# Patient Record
Sex: Male | Born: 1940 | Race: Black or African American | Hispanic: No | Marital: Married | Smoking: Former smoker
Health system: Southern US, Community
[De-identification: ages and names within clinical notes are randomized; demographics above are authoritative.]

## PROBLEM LIST (undated history)

## (undated) DIAGNOSIS — K635 Polyp of colon: Secondary | ICD-10-CM

## (undated) DIAGNOSIS — E119 Type 2 diabetes mellitus without complications: Secondary | ICD-10-CM

## (undated) DIAGNOSIS — K222 Esophageal obstruction: Secondary | ICD-10-CM

## (undated) DIAGNOSIS — I639 Cerebral infarction, unspecified: Secondary | ICD-10-CM

## (undated) DIAGNOSIS — K226 Gastro-esophageal laceration-hemorrhage syndrome: Secondary | ICD-10-CM

## (undated) DIAGNOSIS — K5790 Diverticulosis of intestine, part unspecified, without perforation or abscess without bleeding: Secondary | ICD-10-CM

## (undated) DIAGNOSIS — C61 Malignant neoplasm of prostate: Secondary | ICD-10-CM

## (undated) DIAGNOSIS — K648 Other hemorrhoids: Secondary | ICD-10-CM

## (undated) DIAGNOSIS — I10 Essential (primary) hypertension: Secondary | ICD-10-CM

## (undated) HISTORY — DX: Type 2 diabetes mellitus without complications: E11.9

## (undated) HISTORY — DX: Malignant neoplasm of prostate: C61

## (undated) HISTORY — PX: UPPER GASTROINTESTINAL ENDOSCOPY: SHX188

## (undated) HISTORY — DX: Essential (primary) hypertension: I10

## (undated) HISTORY — PX: COLONOSCOPY: SHX174

## (undated) HISTORY — DX: Diverticulosis of intestine, part unspecified, without perforation or abscess without bleeding: K57.90

## (undated) HISTORY — DX: Esophageal obstruction: K22.2

## (undated) HISTORY — DX: Cerebral infarction, unspecified: I63.9

## (undated) HISTORY — DX: Other hemorrhoids: K64.8

## (undated) HISTORY — DX: Polyp of colon: K63.5

## (undated) HISTORY — PX: HEMICOLECTOMY: SHX854

## (undated) HISTORY — DX: Gastro-esophageal laceration-hemorrhage syndrome: K22.6

---

## 2001-01-07 ENCOUNTER — Emergency Department (HOSPITAL_COMMUNITY): Admission: EM | Admit: 2001-01-07 | Discharge: 2001-01-08 | Payer: Self-pay | Admitting: *Deleted

## 2001-01-15 ENCOUNTER — Ambulatory Visit (HOSPITAL_COMMUNITY): Admission: RE | Admit: 2001-01-15 | Discharge: 2001-01-15 | Payer: Self-pay | Admitting: Internal Medicine

## 2001-01-15 ENCOUNTER — Encounter: Payer: Self-pay | Admitting: Internal Medicine

## 2003-04-13 ENCOUNTER — Ambulatory Visit (HOSPITAL_COMMUNITY): Admission: RE | Admit: 2003-04-13 | Discharge: 2003-04-13 | Payer: Self-pay | Admitting: Family Medicine

## 2003-10-28 ENCOUNTER — Inpatient Hospital Stay (HOSPITAL_COMMUNITY): Admission: EM | Admit: 2003-10-28 | Discharge: 2003-10-31 | Payer: Self-pay | Admitting: Emergency Medicine

## 2005-10-15 ENCOUNTER — Other Ambulatory Visit: Admission: RE | Admit: 2005-10-15 | Discharge: 2005-10-15 | Payer: Self-pay | Admitting: Urology

## 2005-10-15 ENCOUNTER — Encounter (INDEPENDENT_AMBULATORY_CARE_PROVIDER_SITE_OTHER): Payer: Self-pay | Admitting: *Deleted

## 2005-12-11 ENCOUNTER — Ambulatory Visit: Admission: RE | Admit: 2005-12-11 | Discharge: 2006-02-23 | Payer: Self-pay | Admitting: Radiation Oncology

## 2008-03-03 DIAGNOSIS — K5790 Diverticulosis of intestine, part unspecified, without perforation or abscess without bleeding: Secondary | ICD-10-CM

## 2008-03-03 DIAGNOSIS — K226 Gastro-esophageal laceration-hemorrhage syndrome: Secondary | ICD-10-CM

## 2008-03-03 DIAGNOSIS — K222 Esophageal obstruction: Secondary | ICD-10-CM

## 2008-03-03 DIAGNOSIS — K635 Polyp of colon: Secondary | ICD-10-CM

## 2008-03-03 HISTORY — DX: Gastro-esophageal laceration-hemorrhage syndrome: K22.6

## 2008-03-03 HISTORY — DX: Diverticulosis of intestine, part unspecified, without perforation or abscess without bleeding: K57.90

## 2008-03-03 HISTORY — DX: Esophageal obstruction: K22.2

## 2008-03-03 HISTORY — DX: Polyp of colon: K63.5

## 2008-03-23 ENCOUNTER — Ambulatory Visit: Payer: Self-pay | Admitting: Gastroenterology

## 2008-03-23 ENCOUNTER — Inpatient Hospital Stay (HOSPITAL_COMMUNITY): Admission: EM | Admit: 2008-03-23 | Discharge: 2008-03-25 | Payer: Self-pay | Admitting: Emergency Medicine

## 2008-04-26 ENCOUNTER — Ambulatory Visit: Payer: Self-pay | Admitting: Gastroenterology

## 2008-10-25 ENCOUNTER — Encounter: Payer: Self-pay | Admitting: Gastroenterology

## 2008-10-28 DIAGNOSIS — Z8673 Personal history of transient ischemic attack (TIA), and cerebral infarction without residual deficits: Secondary | ICD-10-CM

## 2008-10-28 DIAGNOSIS — E119 Type 2 diabetes mellitus without complications: Secondary | ICD-10-CM

## 2008-10-28 DIAGNOSIS — I10 Essential (primary) hypertension: Secondary | ICD-10-CM | POA: Insufficient documentation

## 2008-10-28 DIAGNOSIS — K5730 Diverticulosis of large intestine without perforation or abscess without bleeding: Secondary | ICD-10-CM | POA: Insufficient documentation

## 2008-10-28 DIAGNOSIS — C61 Malignant neoplasm of prostate: Secondary | ICD-10-CM

## 2008-10-28 DIAGNOSIS — K922 Gastrointestinal hemorrhage, unspecified: Secondary | ICD-10-CM | POA: Insufficient documentation

## 2008-11-01 ENCOUNTER — Ambulatory Visit: Payer: Self-pay | Admitting: Gastroenterology

## 2009-04-17 ENCOUNTER — Ambulatory Visit: Payer: Self-pay | Admitting: Orthopedic Surgery

## 2009-04-17 DIAGNOSIS — M7020 Olecranon bursitis, unspecified elbow: Secondary | ICD-10-CM

## 2009-10-23 ENCOUNTER — Encounter: Payer: Self-pay | Admitting: Urgent Care

## 2009-11-13 ENCOUNTER — Ambulatory Visit (HOSPITAL_COMMUNITY): Admission: RE | Admit: 2009-11-13 | Discharge: 2009-11-13 | Payer: Self-pay | Admitting: Family Medicine

## 2010-02-28 ENCOUNTER — Ambulatory Visit (HOSPITAL_COMMUNITY)
Admission: RE | Admit: 2010-02-28 | Discharge: 2010-02-28 | Payer: Self-pay | Source: Home / Self Care | Admitting: Urology

## 2010-05-07 ENCOUNTER — Ambulatory Visit (HOSPITAL_COMMUNITY)
Admission: RE | Admit: 2010-05-07 | Discharge: 2010-05-07 | Payer: Self-pay | Source: Home / Self Care | Admitting: Urology

## 2010-07-03 NOTE — Medication Information (Signed)
Summary: Tax adviser   Imported By: Diana Eves 10/23/2009 11:49:46  _____________________________________________________________________  External Attachment:    Type:   Image     Comment:   External Document  Appended Document: RX FolderOMEPRAZOLE    Prescriptions: OMEPRAZOLE 20 MG CPDR (OMEPRAZOLE) one by mouth daily 30  mins before breakfast as long as you are on aspirin  #30 x 11   Entered and Authorized by:   Joselyn Arrow FNP-BC   Signed by:   Joselyn Arrow FNP-BC on 10/23/2009   Method used:   Electronically to        [facility]* (retail)       [address]       [place], [place]  [address]       Ph: [phone]       Fax: [phone]   RxID:   [id number] OMEPRAZOLE 20 MG CPDR (OMEPRAZOLE) one by mouth daily 30  mins before breakfast as long as you are on aspirin  #30 x 11   Entered and Authorized by:   Joselyn Arrow FNP-BC   Signed by:   Joselyn Arrow FNP-BC on 10/23/2009   Method used:   Electronically to        [facility]* (retail)       [address]       [place], [place]  [address]       Ph: [phone]       Fax: [phone]   RxID:   [id number]

## 2010-10-16 NOTE — Discharge Summary (Signed)
[name], [name] NO.:  [id number]   MEDICAL RECORD NO.:  [id number]          PATIENT TYPE:  INP   LOCATION:  IC08                          FACILITY:  [facility]   PHYSICIAN:  Osvaldo Shipper, MD     DATE OF BIRTH:  19-Apr-1941   DATE OF ADMISSION:  03/23/2008  DATE OF DISCHARGE:  LH                               DISCHARGE SUMMARY   PRIMARY CARE DOCTOR:  Dr. Nobie Putnam.   During this admission, the patient was seen by Dr. Kassie Mends from  gastroenterology.   PROCEDURES PERFORMED DURING THIS ADMISSION:  An EGD and colonoscopy.   The patient to be transferred to [facility] for surgical evaluation when  a bed is available.   TRANSFER DIAGNOSES:  1. Lower gastrointestinal bleed secondary to diverticulosis, requiring      surgical evaluation.  2. Upper gastrointestinal bleed secondary to Mallory-Weiss tear,      status post clipping and epinephrine injection.  3. History of prostate cancer with treatment in the past.  4. History of cerebrovascular accident in the past.  5. He was on low-dose Coumadin for history of cerebrovascular accident      in the past.   Please review H and P dictated by Dr. Lilian Kapur for details regarding the  patient's presenting illness.   BRIEF HOSPITAL COURSE:  Briefly, this is a 70 year old African American  male who was in his is usual state of health until about a few hours  prior to presentation to the hospital when he started complaining of  lower GI bleeding per rectum.  The patient apparently felt dizzy and  lightheaded.  The patient was admitted to the hospital.  He continued to  have lower GI bleeding throughout the night, and this morning he had a  syncopal episode after one episode of bleeding.  The patient was  transfused blood.  He has received only 1 unit so far and currently the  second unit is infusing.  His blood count in the form of hemoglobin was  13.6 at 11:00 p.m. last night, it dropped down to 10.4 at 5 a.m. this  morning, it was 10.9 at 5 p.m. this evening.  Platelet count is stable.  PT/INR was normal.  His other labs are all pretty much unremarkable.   The patient was seen by Dr. Kassie Mends from gastroenterology.  The  patient underwent an EGD and a colonoscopy.  The EGD showed a Mallory-  Weiss tear, and this was clipped and epinephrine was injected.  He was  found to have severe diverticulosis with bleeding.  Dr. Kassie Mends felt  that the patient needed surgical evaluation for his lower GI bleeding  from diverticulosis.  The patient and his family do not want to be seen  by Dr. Lovell Sheehan, who is covering here at [facility].  In view of this, I  spoke initially with Dr. Synthia Innocent, the ICU doctor at [facility], who  recommended that I speak with one of the [facility] doctors as the  patient may not require an ICU bed as he is hemodynamically stable.  So,  I spoke with Dr. Vedia Coffer with [facility], who asked me to speak with  the general surgeon on-call to make sure they would be able to see the  patient before he will admit him.  I spoke to Dr. Laverta Baltimore,  general surgeon and she said that she will evaluate the patient.  She  did not feel that there was an acute need because no acute bleeding was  noted and because he was hemodynamically stable.  So, Dr. Orvan Falconer has  accepted the patient transfer.  Unfortunately, there are no step-down  beds available at this time.  So, we will await a bed for his transfer.   If the patient continues to have significant bleeding with compromise to  his blood pressure we will re-contact Dr. Sung Amabile to see if he will  accept the patient in transfer.   At the time of this dictation, he is on Norvasc 5 mg daily, he is on  Protonix 40 mg p.o. daily, he is on sequential compressive devices.      Osvaldo Shipper, MD  Electronically Signed     GK/MEDQ  D:  03/23/2008  T:  03/23/2008  Job:  [id number]   cc:   Patrica Duel, M.D.  Fax: [phone]    Kassie Mends, M.D.  [address]  [place] , [place] [address]

## 2010-10-16 NOTE — Op Note (Signed)
NAMESERJIO, DEUPREE NO.:  [id number]   MEDICAL RECORD NO.:  [id number]          PATIENT TYPE:  INP   LOCATION:  IC08                          FACILITY:  [facility]   PHYSICIAN:  Kassie Mends, M.D.      DATE OF BIRTH:  13-Jul-1940   DATE OF PROCEDURE:  03/23/2008  DATE OF DISCHARGE:                               OPERATIVE REPORT   REFERRING PHYSICIAN:  Skeet Latch, M.D.   PROCEDURE:  1. Ileocolonoscopy.  2. Limited esophagogastroduodenoscopy with Boston resolution clips      placed and epinephrine injection.   INDICATION FOR EXAM:  Mr. Colaizzi is a 70 year old male who presented  with large volume painless rectal bleeding.  He was given a bowel prep  and vomited with GoLYTELY.  The emesis contained bright red blood.  He  has had 2 presyncopal episodes but other than that has remained  hemodynamically stable.   FINDINGS:  1. Prior left hemicolectomy.  The anastomosis was approximately 35 cm      from the anal verge.  Multiple diverticula seen beginning at the      anastomosis and continuing to the cecum.  A moderate amount of      liquid dark blood seen in the colon.  No bright red blood      appreciated.  2. Occasional liquid blood seen in the distal terminal ileum.  No      bright red blood in the distal terminal ileum.  3. A 6-mm sessile ascending colon polyp.  The polyp was not removed      due to the fact that the patient has an ongoing GI bleed.  4. No retroflexed view of the rectum was performed.  The forward view      of the rectum appeared within normal limits.  5. Patent distal Schatzki's ring.  No Barret's, erosion, or      ulcerations seen.  6. Just distal to this Schatzki's ring was a Mallory-Weiss tear with      adherent clot.  Mild amount of oozing was seen.  No old blood or      fresh blood was seen in the stomach.  Five Boston resolution clips      were placed on the Mallory-Weiss tear which was rather large.  Four      mL of epinephrine  (1:10,000) was injected.  Hemostasis was      achieved.  No retroflexed view of the cardia was performed, and the      scope was not advanced beyond the pylorus. Advancing the scope      could worsen the Mallory-Weiss tear due to pressure at the GE      junction.   DIAGNOSES:  1. Painless rectal bleeding likely secondary to diverticulosis.  There      is a low likelihood that he had a Mallory-Weiss tear which was not      readily apparent on presentation.  2. Mallory-Weiss tear, most likely source for hematemesis, and      hemostasis was achieved with resolution clips and epinephrine.  3. Severe diverticulosis.  RECOMMENDATIONS:  1. He may have a clear liquid diet.  2. Hemoglobin/hematocrit every 6 hours and transfuse as needed.  3. Consider transfer where surgery bed is available.  The family      declined the service of Dr. Lovell Sheehan and Dr. Leticia Penna.  4. Would hold aspirin, Coumadin and anti-inflammatory drugs until      April 06, 2008.  5. No MRI until April 24, 2008.  6. He should follow up as an outpatient to have a colonoscopy in 2      months with Dr. Cira Servant.   MEDICATIONS:  1. Demerol 50 mg IV.  2. Versed 3 mg IV.   PROCEDURE TECHNIQUE:  Physical exam was performed.  Informed consent was  obtained from the patient after explaining the benefits, risks and  alternatives to the procedure.  The patient was connected to monitor and  placed in the left lateral position.  Continuous oxygen was provided by  nasal cannula and IV medicine administered through an indwelling  cannula.  After administration of sedation and rectal exam, the  patient's rectum was intubated, and the scope was advanced under direct  visualization to the distal terminal ileum.  The scope was removed  slowly by carefully examining the color, texture, anatomy and integrity  of the mucosa on the way out.   After the colonoscopy, the patient's esophagus was intubated with a  diagnostic gastroscope.  The  scope was advanced under direct  visualization to the pylorus.  The scope was removed slowly by carefully  examining the color, texture, anatomy and integrity of the mucosa on the  way out.  Prior to withdrawal of the scope, 5 resolution clips were  placed, and epinephrine was injected.  The patient was recovered in the  ICU.   Marland Kitchen      Kassie Mends, M.D.  Electronically Signed     SM/MEDQ  D:  03/23/2008  T:  03/23/2008  Job:  [id number]   cc:   Patrica Duel, M.D.  Fax: [phone]

## 2010-10-16 NOTE — H&P (Signed)
[name], GANAS NO.:  [id number]   MEDICAL RECORD NO.:  [id number]          PATIENT TYPE:  INP   LOCATION:  A336                          FACILITY:  [facility]   PHYSICIAN:  Skeet Latch, DO    DATE OF BIRTH:  1941/03/30   DATE OF ADMISSION:  03/23/2008  DATE OF DISCHARGE:  LH                              HISTORY & PHYSICAL   PRIMARY CARE PHYSICIAN:  Patrica Duel, M.D.   CHIEF COMPLAINT:  Rectal bleeding.   HISTORY OF PRESENT ILLNESS:  This is a 70 year old African American male  presents with rectal bleeding.  The patient states he has been eating  well.  After having a sensation that he had to have a bowel movement, he  noticed some painless rectal bleeding.  The patient states that he felt  like he needed to have a bowel movement and passed stool.  He noted  blood in the toilet as well as on the toilet tissue.  The patient denied  having straining and any abdominal pain during this episode.  The  patient states that he thinks the stool was mixed with blood, but did  not notice bright red blood on toilet paper and in toilet bowel.  The  patient stated aggravated by nothing, relieved by nothing, and states  that he did have episode of abdominal pain one day prior that went away,  but denies any nausea, vomiting, syncope, fever, chills.  The patient  does take Coumadin for previous CVA in the past.   PAST MEDICAL HISTORY:  CVA, questionable colon cancer.   PAST SURGICAL HISTORY:  Colostomy bag that was reversed   SOCIAL HISTORY:  No history alcohol, illicit drugs or smoking.   ALLERGIES:  NO KNOWN DRUG ALLERGIES.   FAMILY HISTORY:  Unremarkable.   HOME MEDICATIONS:  Coumadin daily, unknown dose, multivitamin daily.  States that he takes a yellow pill, unknown dose frequency and unknown  reason.   REVIEW OF SYSTEMS:  CONSTITUTIONAL:  Unremarkable.  HEENT: Unremarkable.  CARDIOVASCULAR:  Unremarkable.  RESPIRATORY:  Unremarkable.  GASTROINTESTINAL:   Positive for bloody stools.  No nausea, vomiting,  diarrhea, abdominal pain.  GENITOURINARY:  Unremarkable.  MUSCULOSKELETAL:  Unremarkable.  All other systems unremarkable.   PHYSICAL EXAMINATION:  VITAL SIGNS:  Temperature 97.6, respirations 20,  pulse 105, blood pressure 156/84.  HEENT:  Head is atraumatic and normocephalic.  PERRL.  EOMI.  NECK:  Soft, supple, nontender, nondistended.  Oral mucosa is moist.  CARDIOVASCULAR:  Slightly tachycardiac, regular rhythm.  No murmurs,  gallops or murmurs.  LUNGS:  Clear to auscultation bilaterally.  No rales, rhonchi or  wheezing.  ABDOMEN:  Soft, nontender, nondistended.  No organomegaly.  Normal bowel  sounds.  No rigidity or guarding.  Rectal exam done by ER physician  showed gross blood per rectum.  EXTREMITIES:  No clubbing, cyanosis or edema.  NEUROLOGICAL:  Alert and oriented x3.  Cranial nerves II-XII grossly  intact.  SKIN:  Normal.  No rashes, petechiae noted.   LABORATORY DATA:  EKG showed 91 bpm, normal sinus rhythm, right axis  deviation, normal  P-waves, slight inverted T-waves.  No Q-waves noted.  Myoglobin 80.5, CK-MB 1.7, troponin less than 0.05, lipase is 36.  Sodium 137, potassium 3.4, chloride 107, CO2 is 25, glucose 156, BUN 15,  creatinine 1.32, calcium 8.8, total protein 6.2, albumin 3.4, AST is 20,  ALT 13, alkaline phosphatase is 78, total bilirubin 0.4.  PTT is 26, PT  13.3, INR is 1.0.  White count 7.8, hemoglobin 13.6, hematocrit 40.0,  platelet count is 206.  Chest x-ray showed no acute abnormalities.   ASSESSMENT:  1. Rectal bleeding.  2. History of CVA.  3. Hyperglycemia.  4. Hypokalemia.   PLAN:  1. The patient admitted to the service [facility] general medical bed.  2. Rectal bleeding, Will rehydrate the patient at this time.  Will      hold all anticoagulants, aspirin and NSAIDS at this time.  Will      obtain GI consult at this time.  At this time, will keep the      patient n.p.o. pending any  gastroenterology procedures.  Slowly      advance the patient's clear liquid diet, advance as tolerated.  3. Will repeat his hemoglobin/hematocrit as well as PT/PTT and INR      studies in the a.m.  Monitor and any active bleeding.  4. The patient is hyperglycemic, will add hemoglobin A1c to his a.m.      labs at this time.  5. For his hypokalemia will orally replace and recheck in the morning      also.      Skeet Latch, DO  Electronically Signed     SM/MEDQ  D:  03/23/2008  T:  03/23/2008  Job:  [id number]   cc:   Patrica Duel, M.D.  Fax: [phone]

## 2010-10-16 NOTE — Consult Note (Signed)
NAME:  [name], CZARNECKI NO.:  [id number]   MEDICAL RECORD NO.:  [id number]          PATIENT TYPE:  INP   LOCATION:  A336                          FACILITY:  [facility]   PHYSICIAN:  Kassie Mends, M.D.      DATE OF BIRTH:  01-Apr-1941   DATE OF CONSULTATION:  03/23/2008  DATE OF DISCHARGE:                                 CONSULTATION   REFERRING PHYSICIAN:  [facility].   REASON FOR CONSULTATION:  Hematochezia.   HISTORY OF PRESENT ILLNESS:  The patient is a 70 year old African  American gentleman who presents with acute onset of large volume  hematochezia.  He states he had a couple of episodes yesterday prior to  his presentation late last night.  It describes it as fresh blood per  rectum.  He denies any problems with straining or constipation.  He has  had no prior issues before this started.  He denies any abdominal pain.  Since his admission, he has had 2-3 episodes of what was described by  the nursing staff as very large volume bright red blood per rectum.  On  one episode, the patient did have a syncopal episode.  His systolic  pressure was in the 04V around this time, but currently is back at  106/67 with a pulse of 80.  He denies any melena, abdominal pain,  vomiting, dysphagia, odynophagia, heartburn or weight loss.  He states  he remotely used to drink alcohol just on the weekends.  He denies NSAID  or aspirin use.  He is on Coumadin 1 mg daily for history of stroke.  His INR on presentation was only 1, and today is 1.2.   HOME MEDICATIONS:  1. Coumadin 1 mg daily.  2. Multivitamin daily.   ALLERGIES:  NO KNOWN DRUG ALLERGIES.   PAST MEDICAL HISTORY:  1. CVA.  2. History of colostomy bag which was reversed in the remote past,      unclear etiology.  The patient denies a history of cancer, however.  3. He does have a history of prostate cancer treated with seed      implants.  4. Possible history of hypertension.   FAMILY HISTORY:  Negative for  colorectal cancer or chronic GI illnesses.   SOCIAL HISTORY:  He is married, he has two sons.  He is retired.  He has  prior alcohol abuse as above.  Quit smoking a couple years ago.   REVIEW OF SYSTEMS:  GI:  See HPI.  CONSTITUTIONAL:  No weight loss.  CARDIOPULMONARY:  Denies chest pain, shortness of breath, palpitations  or cough.  GENITOURINARY:  Denies dysuria or hematuria.   PHYSICAL EXAMINATION:  VITAL SIGNS:  Temperature 98, pulse 81,  respirations 20, blood pressure 106/67.  O2 sats 97% on room air.  Weight 99.8 kg.  Height 66 inches.GENERAL:  A pleasant, drowsy elderly  black gentleman in no acute distress.  He recently received  Phenergan.SKIN:  Warm and dry, no jaundice.HEENT:  Sclerae anicteric.  Oropharyngeal mucosa moist.CHEST:  Lungs are clear to  auscultation.CARDIAC:  Reveals a regular rate and rhythm.  Normal S1-S2.  No murmurs, rubs or gallops.  ABDOMEN:  Positive bowel sounds.  Abdomen is soft, nontender,  nondistended.  No organomegaly or masses.  He has a vertical incision  from his lower abdomen extending all the way up to above the umbilicus.  Scar from prior colostomy bag in the left mid abdomen.  No abdominal  bruits or hernia.  No rebound or guarding.LOWER EXTREMITIES:  No edema.   LABORATORY DATA:  Sodium 139, potassium 4.2, BUN 14, creatinine 1.32,  glucose 187, total bilirubin 0.4, alkaline phosphatase 56, AST 19, ALT  12, albumin 2.8, lipase 36, PTT 24, INR 1.2.  White blood cell count  7200.  Hemoglobin on admission was 13.6, down to 10.4 this morning.  Platelets 178,000.   IMPRESSION:  The patient is a 70 year old African American gentleman  with large volume hematochezia.  Differential diagnoses includes a  diverticular bleed, colorectal cancer, less likely ischemic colitis,  less likely rapid transient upper gastrointestinal bleed.   RECOMMENDATIONS:  1. Colonoscopy today as discussed with Dr. Kassie Mends.  2. Continue PPI.  3. Further  recommendations to follow.   I would like to thank [facility] for allowing [place] to take part in  the care of this patient.      Tana Coast, P.A.      Kassie Mends, M.D.  Electronically Signed    LL/MEDQ  D:  03/23/2008  T:  03/23/2008  Job:  [id number]   cc:   Patrica Duel, M.D.  Fax: [phone]   [facility] P Team

## 2010-10-16 NOTE — Group Therapy Note (Signed)
NAMEJOHNGABRIEL, [name] NO.:  [id number]   MEDICAL RECORD NO.:  [id number]          PATIENT TYPE:  INP   LOCATION:  IC08                          FACILITY:  [facility]   PHYSICIAN:  Osvaldo Shipper, MD     DATE OF BIRTH:  1941-04-01   DATE OF PROCEDURE:  03/24/2008  DATE OF DISCHARGE:                                 PROGRESS NOTE   This is day 2 of this admission.   Patient continues to be in the intensive care unit here at [facility].   SUBJECTIVELY:  Patient is feeling much better.  He stated he is much  improved compared to yesterday.  He denies any more bleeding since 7  p.m. last night when he had his last episode.  He denies any abdominal  pain, nausea, vomiting.  He denies any more syncopal episodes and  overall he feels much better.   OBJECTIVELY:  He is afebrile.  His heart rate is in the 60s and 70s.  Blood pressure is normal 132/66.  Respiratory rate is 14.  Saturation  95% on 2 L by nasal cannula.   PHYSICAL EXAMINATION:  Obese black male in no distress.  HEENT:  There is no pallor, no icterus.  Oral mucous membrane is moist.  No oral lesions are noted.  NECK:  Soft and supple.  No thyromegaly is appreciated.  LUNGS:  Clear to auscultation bilaterally.  No wheezes, rales or  rhonchi.  CARDIOVASCULAR:  S1, S2 is normal, regular.  No murmurs appreciated.  No  S3, S4.  No rubs, no bruits.  ABDOMEN:  Soft, nontender, nondistended.  EXTREMITIES:  Do not show edema.  NEUROLOGIC:  He is alert, oriented x3.  No focal neurological deficits  are present.   LABORATORIES:  His H and H has been stable, it was 11.1.  His white  count is normal.  Platelet count is 145 this morning.  His Hb1c came  back at 6.5.   ASSESSMENT AND PLAN:  1. Lower gastrointestinal bleeding.  Diverticular.  He is status post      colonoscopy.  Gastroenterologist recommends surgical evaluation.      Patient does not want to see Dr. Lovell Sheehan so he will be transferred      to [facility]  sometime today to [facility] over there so that they      can obtain a surgical evaluation.  I have already discussed the      case with Dr. Laverta Baltimore who is a general surgeon there and      she or one of her colleagues will be happy to consult on this      individual.  2. Mallory-Weiss tear causing hematemesis also resolved.  He underwent      an EGD with clipping and epinephrine injection.  3. Hyperglyceridemia.  Patient appears to have diabetes.  He is not,      as far as we know, not on any anti-diabetic medications.  Looking      back in his records it appears that this will be a new diagnosis  for him.  This will most likely will have to be addressed by his      primary medical doctor when he is discharged from [facility].  He      may also be started on oral hyperglycemic agents such as metformin      or glipizide.  4. History of a stroke, stable.  5. History of colectomy in the past for unclear reasons.  6. He is on deep vein thrombosis prophylaxis with sequential      compression devices.  7. He has a history of hypertension for which he is on Norvasc which      is also stable.   So at this time we are waiting transfer to [facility].  He has been  accepted by Dr. Vedia Coffer and he has been assigned to [facility] team  D.  If the patient does not leave by early this morning, I will give a  call to the [facility] team to update that the patient is still here and  that he might be coming sometime today.   His discharge summary was dictated yesterday.      Osvaldo Shipper, MD  Electronically Signed     GK/MEDQ  D:  03/24/2008  T:  03/24/2008  Job:  [id number]   cc:   [facility] D team  [facility]   Lennie Muckle, MD  [address]   [address]  [place] [address]   Patrica Duel, M.D.  Fax: [phone]

## 2010-10-16 NOTE — Consult Note (Signed)
NAME:  [name], DELORENZO NO.:  [id number]   MEDICAL RECORD NO.:  [id number]          PATIENT TYPE:  INP   LOCATION:  IC08                          FACILITY:  [facility]   PHYSICIAN:  Kassie Mends, M.D.      DATE OF BIRTH:  06/03/1941   DATE OF CONSULTATION:  DATE OF DISCHARGE:                                 CONSULTATION   ADDENDUM:  This is a late entry to the chart.   After seeing the patient in consultation this morning, I was called back  to the room.  He had another syncopal episode while having a bloody  bowel movement.  The patient was already responsive by the time that I  was notified.  We assisted him back to the bed.  His vital signs were  monitored every 5 to 10 minutes.  He was noted to have a blood pressure  initially in the 100s systolic.  It did get up to 131, and by the time  that I had left the patient's bedside, his blood pressure was 120/52  with a pulse of 65.  He was completely responsive.  There was no noted  weakness on his left or right side.  He was alert and oriented.  Orders  were given for stat H and H, keep 4 units of packed red blood cells on  hold at all times to transfuse 1 unit of packed red blood cells.  I also  notified Dr. Kassie Mends of the event.  We contacted the hospitalist and  I spoke to them directly and recommended that he be transferred to the  ICU when a bed was available.  Dr. Cira Servant had plans to talk to surgery  about being involved in the patient's care, since he is going to be  difficult.  It is appearing that he is having a significant ongoing GI  bleeding.  When the hospitalist arrived to the floor, the patient's  bedside, the patient was stable at that time.      Tana Coast, P.A.      Kassie Mends, M.D.  Electronically Signed    LL/MEDQ  D:  03/23/2008  T:  03/23/2008  Job:  [id number]   cc:   [facility] P team   Patrica Duel, M.D.  Fax: [phone]

## 2010-10-16 NOTE — Discharge Summary (Signed)
NAMEDRAEDEN, KELLMAN NO.:  [id number]   MEDICAL RECORD NO.:  [id number]          PATIENT TYPE:  INP   LOCATION:  IC08                          FACILITY:  [facility]   PHYSICIAN:  Osvaldo Shipper, MD     DATE OF BIRTH:  1940-07-28   DATE OF ADMISSION:  03/22/2008  DATE OF DISCHARGE:  LH                               DISCHARGE SUMMARY   The patient's PMD is Dr. Nobie Putnam.   The patient was seen by Dr. Kassie Mends and by Dr. Jena Gauss from GI.   Please review the discharge summary dictated on October 21 for details  regarding the patient's hospital stay and his presenting illness.   Please also review the H and P dictated by Dr. Lilian Kapur for details  regarding his presenting illness.   DISCHARGE DIAGNOSES:  1. Lower gastrointestinal bleed secondary to diverticulosis, resolved.  2. Mallory-Weiss tear causing upper gastrointestinal bleed, resolved.  3. History of prostate cancer in the past.  4. Hypertension requiring treatment.  5. Hyperglycemia with HbA1c of 6.5, likely diabetic, requiring      outpatient follow-up.  6. History of cerebral vascular accident in the past on low-dose      Coumadin.   BRIEF HOSPITAL COURSE:  Briefly, this is a 70 year old African American  male who came into the hospital with complaints of bright red blood per  rectum.  The patient was seen by GI and underwent colonoscopy which  showed a lot of diverticulosis and bleeding from those sites.  He was  also found to have Mallory-Weiss tear on an EGD which was done for  hematemesis, which was clipped and epinephrine was injected.  His  hemoglobin did drop slightly for which he required 2 units of blood, and  hemoglobin has remained stable.  Initially, it was thought that the  patient required surgical evaluation, and plans were to transfer him to  [facility], but because of lack of beds, the patient could not go  immediately.  In the meantime, his bleeding subsided.  He has not had  any active  bleeding in about 24 hours.  GI feels that at this time he  does not need surgery, so the transfer was cancelled.  The patient was  feeling much better.  His vital signs have all been stable.  I believe  that if the patient tolerates p.o. diet today he can probably  potentially go home.  I would defer to GI to decide that.   He also was found to be hypertensive during this admission for which he  was started on Norvasc and hydrochlorothiazide.  His blood pressures are  now better controlled.  He will also be prescribed some potassium to go  with the hydrochlorothiazide.   He was also found to be hyperglycemic with blood sugars in the 150s and  180s.  HbA1c was checked and was found to be 6.5 with a mean glucose of  140.  The patient could have mild diabetes.  I will defer to his PMD to  initiate treatment.  Diet modification may be considered to begin with.  On the day of discharge, the patient is feeling quite well.  No  complaints offered.  No nausea, vomiting, no abdominal pain.  His vitals  are all stable.  His examination is completely benign.  At this time, I  think he is stable for discharge.  His labs show that his potassium is  3.4; his hemoglobin is 10.7.  His potassium will be repleted before he  is discharged.   DISCHARGE MEDICATIONS:  1. Norvasc 5 mg once daily.  2. Hydrochlorothiazide 25 mg once daily.  3. Potassium chloride 20 mEq once daily.  4. Multivitamin once daily.   He had been asked to hold his Coumadin until November 4.  No MRI until  November 22 because of the clips that were placed during the EGD.   FOLLOW-UP:  He will need to see GI rather quickly to decide on further  course of action for his diverticulosis.  He will need to see his PMD,  Dr. Nobie Putnam, to decide on treatment for his hyperglycemia and also to  follow up on his hypertension.   DIET:  Modified carbohydrate.   PHYSICAL ACTIVITY:  Increase activity slowly.   Other studies done during  this admission include a portable chest x-ray  which showed no acute abnormalities.  The procedures including EGD and  colonoscopy have been discussed.   Total time on this discharge encounter 35 minutes.      Osvaldo Shipper, MD  Electronically Signed     GK/MEDQ  D:  03/25/2008  T:  03/25/2008  Job:  [id number]   cc:   Patrica Duel, M.D.  Fax: [phone]   R. Roetta Sessions, M.D.  [address]  [place]  [place] [address]   Kassie Mends, M.D.  [address]  [place] , [place] [address]   Laverta Baltimore, M.D.

## 2010-10-16 NOTE — Assessment & Plan Note (Signed)
NAME:  [name], [name]                 CHART#:  [id number]   DATE:  04/26/2008                       DOB:  06-Feb-1941   REFERRING PHYSICIAN:  Milus Mallick. Lodema Hong, MD   PROBLEM LIST:  1. Lower GI bleed secondary to diverticulosis.  2. Upper GI bleed secondary to Mallory-Weiss tear with clipping and      epinephrine injection in October 2009.  3. History of prostate cancer.  4. History of cerebrovascular accident in the past requiring chronic      low dose Coumadin.   SUBJECTIVE:  The patient is a 70 year old male, who presents as a return  patient visit.  He was last seen in October 2009 as an inpatient.  He  states the tingling is better now, but he is not on Coumadin.  He also  is in a better mood.  He denies any rectal bleeding, vomiting blood, or  black-tarry stools.  He is not taking aspirin or Coumadin.   PAST SURGICAL HISTORY:  1. Left hemicolectomy possibly secondary to complicated      diverticulitis.  2. Diabetes managed with diet.  3. Hypertension.   MEDICATIONS:  1. Potassium.  2. Amlodipine 5 mg daily.   OBJECTIVE:  VITAL SIGNS:  Weight 220 pounds, height 5 feet 11,  temperature 98.5, blood pressure 128/82, and pulse 60.GENERAL:  He is in  no apparent distress.  Alert and oriented x4.LUNGS:  Clear to  auscultation bilaterally.CARDIOVASCULAR:  Regular rhythm.  No  murmur.ABDOMEN:  Bowel sounds are present, soft, nontender, and  nondistended.   ASSESSMENT:  The patient is a 70 year old male with a history of lower  gastrointestinal bleed, on Coumadin and a history of cerebrovascular  accident. Thank you for allowing me to see the patient in consultation.  My recommendations follow.   RECOMMENDATIONS:  1. The patient is asked to begin baby aspirin daily.  He is to take      Prilosec while on aspirin.  2. He should continue the Norvasc.  I explained to him that he does      not need the potassium if      he is not on the diuretics.  He is asked to stop the  potassium.  3. He has a follow up appointment to see me in 6 months.       Kassie Mends, M.D.  Electronically Signed     SM/MEDQ  D:  04/26/2008  T:  04/26/2008  Job:  [id number]   cc:   Milus Mallick. Lodema Hong, M.D.

## 2010-10-16 NOTE — Group Therapy Note (Signed)
NAME:  [name], [name] NO.:  [id number]   MEDICAL RECORD NO.:  [id number]          PATIENT TYPE:  INP   LOCATION:  IC08                          FACILITY:  [facility]   PHYSICIAN:  Monte Fantasia, MD  DATE OF BIRTH:  1940/11/26   DATE OF PROCEDURE:  DATE OF DISCHARGE:                                 PROGRESS NOTE   The patient called in by the nurse today morning at around 9 a.m., as  the patient was found in the bathroom having a rectal bleed.  As per the  nurse, the patient had had, this is the second episode.  The patient was  admitted early in the morning today for rectal bleed and was on  monitoring.  The patient was found in the bathroom today early in the  morning, this is the second episode, had a huge bowel movement.  The  toilet bowl was full of blood.  No evidence of any clots.  The vital  signs when taken on admission was 88/46 with a blood pressure and heart  rate of 75.  When the patient was found on the floor on the bathroom,  the vital signs were blood pressure of 102/64 and the heart rate was 71.  The patient was not found to be orthostatic.  The patient denies any  complaints of chest pain, shortness of breath, any headaches, nausea,  vomiting, or diaphoresis.  He was evaluated by the GI physician who was  in the room and was informed the patient needed possible surgical  consult due to the huge rectal bleed.   PHYSICAL EXAMINATION:  VITAL SIGNS:  Heart rate of 71, blood pressure of  102/64, temperature of 98, and O2 sat of 97%.  HEENT:  Pallor plus no evidence of any JVD.  No icterus.  No  lymphadenopathy.  Pupils equal and reacting to light.  RESPIRATORY SYSTEM:  Air entry bilaterally equal.  No rales, no rhonchi.  ABDOMEN:  Soft.  No guarding.  No rigidity.  No tenderness.  No  costovertebral tenderness.  EXTREMITIES:  No evidence of any edema of feet.  Pedal pulses are felt.   Latest H&H are 10.1/30.  On admission, the patient had an H&H of  13.6/40, had a drop of 2 g hemoglobin.  Type and cross has been done and  the patient at present is receiving 1 unit of PRBC transfusion.   ADVICE, ASSESSMENT, AND PLAN:  CBC q.6 h.  IV fluids normal saline at  100 mL per hour.  Type and cross for 4 units, transfuse 2 units now.  The patient to be transferred to ICU.  The patient's family informed and  discussed with the current condition for the need to be in the ICU and  need for close monitoring.  Surgical consult in view of rectal bleed and  ileus.      Monte Fantasia, MD  Electronically Signed     MP/MEDQ  D:  03/23/2008  T:  03/23/2008  Job:  [id number]

## 2010-10-19 NOTE — Discharge Summary (Signed)
NAME:  [name], [name]                          ACCOUNT NO.:  [id number]   MEDICAL RECORD NO.:  [id number]                   PATIENT TYPE:  INP   LOCATION:  A220                                 FACILITY:  [facility]   PHYSICIAN:  Patrica Duel, M.D.                 DATE OF BIRTH:  1940/07/03   DATE OF ADMISSION:  DATE OF DISCHARGE:  10/31/2003                                 DISCHARGE SUMMARY   DISCHARGE DIAGNOSES:  1. Cerebrovascular accident/ transient ischemic attack, most likely embolic     phenomenon secondary to carotid artery disease, documented this     admission, minimal residual.  2. History of hypertension, well controlled.  3. History of exploratory surgery for perforated viscus (of the colon).   For details regarding admission, please refer to the admitting note.   Briefly, this 70 year old male with the above history, was mowing his grass  when he developed garbled speech, stumbling gait, a tremor of his right  upper extremity.  He was brought to the hospital for further evaluation.  His symptoms had resolved spontaneously except for mild dysarthria and  facial droop to the right.  CT was negative.  Lab was normal.  His vitals  were normal except for one blood pressure recorded at 193 systolic.   The patient was admitted for evaluation of acute neurologic deficits as  noted above.   HOSPITAL COURSE:  The patient has remained stable.  He was placed on Lovenox  empirically.  His symptoms have cleared except for a facial droop. His  dysarthria is essentially resolved.  He has not peripheral neurologic  deficits.  Echocardiogram is obtained which was unrevealing.  Carotid  Dopplers were obtained which revealed bilateral carotid stenosis.   Currently, the patient is stable for discharge.  He may need intervention by  a vascular surgeon for carotid stenosis.   DISPOSITION:  Medications include aspirin 81 mg daily, and Plavix 75 mg  daily.  Dr. Kristen Loader. Early will be consulted  as an outpatient.     ___________________________________________                                         Patrica Duel, M.D.   MC/MEDQ  D:  10/31/2003  T:  10/31/2003  Job:  [id number]

## 2010-10-19 NOTE — Procedures (Signed)
NAME:  KORTNEY, SCHOENFELDER                          ACCOUNT NO.:  [id number]   MEDICAL RECORD NO.:  [id number]                   PATIENT TYPE:  INP   LOCATION:  A220                                 FACILITY:  [facility]   PHYSICIAN:  Zion Bing, M.D.               DATE OF BIRTH:  03/12/1941   DATE OF PROCEDURE:  10/28/2003  DATE OF DISCHARGE:                                  ECHOCARDIOGRAM   CLINICAL DATA:  70 year-old gentleman with CVA.   M-MODE:  Aorta 3.5, left atrium 4.7, septum 1.5, posterior wall 1.2, LV  diastole 4.2, LV systole 2.6.   1. Technically adequate echocardiographic study.  2. Mild left atrial enlargement; normal right atrial size.  3. Normal right ventricular size and function; borderline right ventricular     hypertrophy.  4. Mild mitral valve thickening; trivial regurgitation; mild annular     calcification.  5. Normal tricuspid and pulmonic valves.  6. Mild aortic sclerosis; mild annular calcification.  7. Normal inferior vena cava.  8. Normal internal dimension of the left ventricle; mild hypertrophy with     disproportionate septal thickening.  Normal regional and global left     ventricular systolic function.      ___________________________________________                                            Compton Bing, M.D.   RR/MEDQ  D:  10/28/2003  T:  10/30/2003  Job:  [id number]

## 2010-10-19 NOTE — H&P (Signed)
NAME:  [name], [name]                          ACCOUNT NO.:  [id number]   MEDICAL RECORD NO.:  [id number]                   PATIENT TYPE:  INP   LOCATION:  A220                                 FACILITY:  [facility]   PHYSICIAN:  Patrica Duel, M.D.                 DATE OF BIRTH:  1940-11-12   DATE OF ADMISSION:  10/28/2003  DATE OF DISCHARGE:                                HISTORY & PHYSICAL   CHIEF COMPLAINT:  Weakness and garbled speech.   HISTORY OF PRESENT ILLNESS:  This is a 70 year old male with a longstanding  history of hypertension.  This has been well controlled with alpha blocker.  The patient also has a history of exploratory surgery in the remote past for  an apparent perforated viscus with no residual effect.   The patient was mowing his grass this morning when he came inside with  garbled speech, a stumbling gait, and a tremor of his right upper extremity.  EMS was summoned, and he was brought to the hospital for further evaluation.  His symptoms have resolved spontaneously except for a mild dysarthria only.  In the emergency department, the workup was essentially benign with a  negative CT, a normal laboratory, and normal vital signs except for one  blood pressure recorded with a systolic of 193.   The patient is admitted with apparent TIA versus CVA.  There is no history  of head trauma, nausea, vomiting, diarrhea, hematemesis, melena,  hematochezia.  He also denies any genitourinary symptoms or other neurologic  deficits.   CURRENT MEDICATIONS:  Include Hytrin 2 mg q.h.s.   ALLERGIES:  No known.   PAST MEDICAL HISTORY:  As noted above.   REVIEW OF SYSTEMS:  Negative except as mentioned.   FAMILY HISTORY:  Noncontributory.   SOCIAL HISTORY:  Nonsmoker, nondrinker.  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   PHYSICAL EXAMINATION:  VITAL SIGNS:  Currently blood pressure 144/88, heart  rate 60 and regular.  He is afebrile.  Respirations are 18 and unlabored.  GENERAL:  A very pleasant, alert male in no acute distress.  HEENT:  Normocephalic and atraumatic.  There is no facial droop.  Pupils are  equal, round and reactive to light and accommodation.  Extraocular movements  are intact.  Ears, nose, and throat are benign.  NECK:  Supple.  No audible bruits are present.  There are also no masses,  thyromegaly, or lymphadenopathy.  LUNGS:  Clear to A&P.  HEART:  Normal without murmurs, rubs or gallops.  ABDOMEN:  Nontender, nondistended.  Bowel sounds are intact.  EXTREMITIES:  No clubbing, cyanosis or edema.  NEUROLOGIC:  Currently reveals a very mild dysarthria but the patient is  fully alert, oriented, and appropriate.  There are no focal findings on  complete neurologic exam.  Motor, sensory, cerebellar functions and cranial  nerves II-XII are intact.  Toes are downgoing.   LAB  REVIEW:  CBC is normal.  MET-7 reveals a glucose of 122, otherwise  normal.  Pro time normal.   EKG:  Normal sinus rhythm.  Poor R wave progression.  No acute changes.   A CT scan of his head revealed no hemorrhage or acute abnormalities.   ASSESSMENT:  Transient ischemic attack versus cerebrovascular accident in a  mildly hypertensive individual.  Symptoms are resolving spontaneously  without therapy at this time.   PLAN:  Lovenox.  Echo.  Doppler of the carotid arteries.  Neurology consult.  Prudent blood pressure control.  Will follow and treat expectantly.     ___________________________________________                                         Patrica Duel, M.D.   MC/MEDQ  D:  10/28/2003  T:  10/28/2003  Job:  [id number]

## 2010-10-23 ENCOUNTER — Encounter: Payer: Self-pay | Admitting: Gastroenterology

## 2010-10-25 ENCOUNTER — Telehealth: Payer: Self-pay

## 2010-10-25 NOTE — Telephone Encounter (Signed)
Fax from [facility] pharmacy requesting to change the Rx for Omeprazole to Protonix since pt is on Plavix. Please advise!

## 2010-10-30 ENCOUNTER — Telehealth: Payer: Self-pay

## 2010-10-30 NOTE — Telephone Encounter (Signed)
LM with pt's wife that Dr. Darrick Penna was calling in the Protonix. Called Rx to Tracy at [facility].

## 2010-10-30 NOTE — Telephone Encounter (Signed)
Per Lorenza Burton, NP, informed pt he needs appt, and also informed Caryn Bee @ [facility]. Pt has appt with Dr. Darrick Penna on 11/15/2010.

## 2010-10-30 NOTE — Telephone Encounter (Signed)
Informed Caryn Bee @ [facility] pharmacy. Called pt, he has appt here on 11/15/2010 @ 10:00 Am with Dr. Darrick Penna.

## 2010-10-30 NOTE — Telephone Encounter (Signed)
Pt not seen since 2010. Change OMP to Protonix 40 mg 30 minutes prior to meals, #30, rfx1. Needs to keep appt 6/14

## 2010-11-15 ENCOUNTER — Ambulatory Visit (INDEPENDENT_AMBULATORY_CARE_PROVIDER_SITE_OTHER): Payer: Medicare Other | Admitting: Gastroenterology

## 2010-11-15 ENCOUNTER — Encounter: Payer: Self-pay | Admitting: Gastroenterology

## 2010-11-15 DIAGNOSIS — D126 Benign neoplasm of colon, unspecified: Secondary | ICD-10-CM

## 2010-11-15 DIAGNOSIS — K635 Polyp of colon: Secondary | ICD-10-CM

## 2010-11-15 DIAGNOSIS — K5730 Diverticulosis of large intestine without perforation or abscess without bleeding: Secondary | ICD-10-CM

## 2010-11-15 NOTE — Progress Notes (Signed)
  Subjective:    Patient ID: [name], male    DOB: Nov 24, 1940, 70 y.o.   MRN: [id number]  PCP: [facility]  HPI  Pt no GI bleeding. Weight is up. No problems swallowing or with heartburn/indigestion. Nl BM daily. Gained 10 lbs since 2010.  Past Medical History  Diagnosis Date  . Prostate cancer SEP 2011 BONE METS  . CVA (cerebral vascular accident)     off coumadin  . DM (diabetes mellitus)   . HTN (hypertension)   . Diverticulosis 10/09    LGIB  . Schatzki's ring OCT 2009  . Colon polyp OCT 2009  . Mallory - Weiss tear OCT 2009    AFTER VOMITING FROM GO-LYTELY   Past Surgical History  Procedure Date  . Hemicolectomy LEFT ?complicated diverticulitis  . Colonoscopy 2009 2O TO LGIB    NL TI, AC POLYP not removed, TICS  . Upper gastrointestinal endoscopy 2009 2o to hematemesis    limited 2o to LARGE MW TEAR, S/P CLIPS x5   No Known Allergies  Current Outpatient Prescriptions  Medication Sig Dispense Refill  . amLODipine (NORVASC) 5 MG tablet Take 5 mg by mouth daily.        Marland Kitchen aspirin 81 MG tablet Take 81 mg by mouth daily.        . clopidogrel (PLAVIX) 75 MG tablet Take 75 mg by mouth daily.        . enalapril (VASOTEC) 10 MG tablet Take 10 mg by mouth daily.        . fish oil-omega-3 fatty acids 1000 MG capsule Take 2 g by mouth daily.        Marland Kitchen omeprazole (PRILOSEC) 20 MG capsule Take 20 mg by mouth daily.        . pantoprazole (PROTONIX) 40 MG tablet Take 40 mg by mouth daily.        . potassium chloride SA (K-DUR,KLOR-CON) 20 MEQ tablet Take 20 mEq by mouth 2 (two) times daily.       . sildenafil (VIAGRA) 25 MG tablet Take 25 mg by mouth daily as needed.        . simvastatin (ZOCOR) 10 MG tablet Take 10 mg by mouth at bedtime.         Family History  Problem Relation Age of Onset  . Colon cancer Neg Hx   . Colon polyps Neg Hx       Review of Systems  Cardiovascular: Negative for chest pain.  All other systems reviewed and are negative.       Objective:   Physical Exam  Vitals reviewed. Constitutional: He is oriented to person, place, and time. He appears well-developed and well-nourished. No distress.  HENT:  Head: Normocephalic and atraumatic.  Cardiovascular: Normal rate, regular rhythm and normal heart sounds.   Pulmonary/Chest: Effort normal and breath sounds normal.  Abdominal: Soft. Bowel sounds are normal. He exhibits no distension. There is no tenderness. There is no rebound.       Midline incision well healed. LLQ INCISION WELL HEALED.  Neurological: He is alert and oriented to person, place, and time.          Assessment & Plan:

## 2010-11-15 NOTE — Progress Notes (Signed)
Reminder in epic to follow up in 2 years

## 2010-11-15 NOTE — Assessment & Plan Note (Addendum)
No recurrent bleeding.  NEEDS TCS 2012 not 2014 since poly remains from 2009. Miralax prep: 17 gms in 8 oz. Liquid  q1h, start 12 n and last dose 8 PM.  Needs 8 oz clear liquids q1h, first dose: 1230p, last dose: 830p. Dulcolax 10 mg at 3p and 6p. Fleets enema AM of procedure.  Clear liquids beginning with breakfast on day before procedure. May continue Plavix and ASA 2o to Hx: CVA .  Called pt to discuss-LM at home 6/14, 1030a. OPV in 2 years.

## 2010-11-15 NOTE — Progress Notes (Signed)
Cc to PCP 

## 2010-11-26 NOTE — Progress Notes (Signed)
Pt scheduled for tcs 11/30/10@9 ;15am.  I spoke with pt's wife about appt. They will come by office to pick up instructions.

## 2010-11-30 ENCOUNTER — Ambulatory Visit (HOSPITAL_COMMUNITY)
Admission: RE | Admit: 2010-11-30 | Discharge: 2010-11-30 | Disposition: A | Discharge status: Home / Self Care | Payer: Medicare Other | Source: Ambulatory Visit | Attending: Gastroenterology | Admitting: Gastroenterology

## 2010-11-30 ENCOUNTER — Encounter: Payer: PRIVATE HEALTH INSURANCE | Admitting: Gastroenterology

## 2010-11-30 ENCOUNTER — Other Ambulatory Visit: Payer: Self-pay | Admitting: Gastroenterology

## 2010-11-30 DIAGNOSIS — Z860100 Personal history of colon polyps, unspecified: Secondary | ICD-10-CM | POA: Insufficient documentation

## 2010-11-30 DIAGNOSIS — K5730 Diverticulosis of large intestine without perforation or abscess without bleeding: Secondary | ICD-10-CM

## 2010-11-30 DIAGNOSIS — K648 Other hemorrhoids: Secondary | ICD-10-CM | POA: Insufficient documentation

## 2010-11-30 DIAGNOSIS — Z7982 Long term (current) use of aspirin: Secondary | ICD-10-CM | POA: Insufficient documentation

## 2010-11-30 DIAGNOSIS — E785 Hyperlipidemia, unspecified: Secondary | ICD-10-CM | POA: Insufficient documentation

## 2010-11-30 DIAGNOSIS — Z79899 Other long term (current) drug therapy: Secondary | ICD-10-CM | POA: Insufficient documentation

## 2010-11-30 DIAGNOSIS — I10 Essential (primary) hypertension: Secondary | ICD-10-CM | POA: Insufficient documentation

## 2010-11-30 DIAGNOSIS — E119 Type 2 diabetes mellitus without complications: Secondary | ICD-10-CM | POA: Insufficient documentation

## 2010-11-30 DIAGNOSIS — D126 Benign neoplasm of colon, unspecified: Secondary | ICD-10-CM

## 2010-11-30 LAB — GLUCOSE, CAPILLARY: Glucose-Capillary: 107 mg/dL — ABNORMAL HIGH (ref 70–99)

## 2010-12-10 NOTE — Progress Notes (Signed)
Path cc to PCP an 10 yr f/u is in the computer

## 2010-12-26 NOTE — Op Note (Signed)
  NAMESHONTE, SODERLUND NO.:  [id number]  MEDICAL RECORD NO.:  [id number]  LOCATION:  DAYP                          FACILITY:  [facility]  PHYSICIAN:  Jonette Eva, M.D.     DATE OF BIRTH:  09-Feb-1941  DATE OF PROCEDURE:  11/30/2010 DATE OF DISCHARGE:                              OPERATIVE REPORT   REFERRING PROVIDER:  Madelin Rear. Sherwood Gambler, MD.  PROCEDURE:  Ileocolonoscopy with cold forceps polypectomy.  INDICATION FOR EXAM:  Mr. Dai is a 70 year old male, who presented with a GI bleed in 2009.  He had an ascending colon polyp that was not removed due to his ongoing GI bleed.  He presents to have a polypectomy.  FINDINGS: 1. Normal terminal ileum approximately 5 cm visualized. 2. A 4-mm sessile ascending colon polyp removed via cold forceps.  A 2-     mm sessile ascending colon polyp removed via cold forceps.     Otherwise, no masses, inflammatory changes, or AVMs seen. 3. Frequent diverticula seen proximal to the anastomosis. 4. Normal anastomosis approximately 35 cm from the anal verge. 5. Large internal hemorrhoids.  Otherwise, normal retroflexed view of     the rectum.  DIAGNOSES: 1. Two ascending colon polyps removed. 2. Pancolonic diverticulosis. 3. Large internal hemorrhoids.  RECOMMENDATIONS: 1. Screening colonoscopy in 10 years with the benefits outweigh the     risk. 2. We will call him with results of him biopsies. 3. He should follow a high-fiber diet.  He is given a handout on high-     fiber diet, polyps, diverticulosis, and hemorrhoids. 4. Hold aspirin for 3 days.  Continue Plavix. 5. He has a follow-up appointment to see me in 2 years.  MEDICATIONS: 1. Demerol 50 mg IV. 2. Versed 3 mg IV.  PROCEDURE TECHNIQUE:  Physical exam was performed.  Informed consent was obtained from the patient explaining the benefits, risks, and alternatives to the procedure.  The patient was connected to monitor and placed in left lateral position.   Continuous oxygen was provided by nasal cannula and IV medicine administered through an indwelling cannula.  After administration of sedation and rectal exam, the patient's rectum was intubated.  The scope was advanced under direct visualization to the distal terminal ileum. The scope was removed slowly by careful examine the color, texture, anatomy, and integrity of the mucosa way out.  The patient was recovered in endoscopy and discharged home in satisfactory condition.  PATH: SIMPLE ADENOMA   Jonette Eva, M.D.     SF/MEDQ  D:  11/30/2010  T:  11/30/2010  Job:  [id number]  cc:   Madelin Rear. Sherwood Gambler, MD Fax: [phone]  Electronically Signed by Jonette Eva M.D. on 12/26/2010 12:45:45 PM

## 2010-12-27 ENCOUNTER — Other Ambulatory Visit: Payer: Self-pay | Admitting: Gastroenterology

## 2011-03-04 LAB — HEMOGLOBIN AND HEMATOCRIT, BLOOD
HCT: 30.4 — ABNORMAL LOW
Hemoglobin: 10.1 — ABNORMAL LOW

## 2011-03-04 LAB — CBC
HCT: 31.7 — ABNORMAL LOW
HCT: 32.3 — ABNORMAL LOW
HCT: 40
Hemoglobin: 10.4 — ABNORMAL LOW
Hemoglobin: 10.8 — ABNORMAL LOW
Hemoglobin: 10.9 — ABNORMAL LOW
Hemoglobin: 13.6
MCHC: 33.9
MCHC: 34
MCHC: 34.3
MCV: 88.5
Platelets: 145 — ABNORMAL LOW
Platelets: 154
Platelets: 206
RBC: 3.52 — ABNORMAL LOW
RBC: 3.62 — ABNORMAL LOW
RBC: 4.52
RDW: 13.7
RDW: 13.9
RDW: 14.3
RDW: 14.4
WBC: 7.7
WBC: 7.8

## 2011-03-04 LAB — COMPREHENSIVE METABOLIC PANEL
ALT: 12
ALT: 13
AST: 20
Albumin: 2.8 — ABNORMAL LOW
Albumin: 3.4 — ABNORMAL LOW
Alkaline Phosphatase: 56
Alkaline Phosphatase: 78
BUN: 15
CO2: 25
Calcium: 8.8
Chloride: 107
Creatinine, Ser: 1.32
GFR calc Af Amer: >60
GFR calc non Af Amer: 54 — ABNORMAL LOW
Glucose, Bld: 156 — ABNORMAL HIGH
Glucose, Bld: 187 — ABNORMAL HIGH
Potassium: 3.4 — ABNORMAL LOW
Potassium: 4.2
Sodium: 137
Sodium: 139
Total Bilirubin: 0.4
Total Protein: 5.1 — ABNORMAL LOW
Total Protein: 6.2

## 2011-03-04 LAB — TYPE AND SCREEN
ABO/RH(D): B POS
Antibody Screen: NEGATIVE

## 2011-03-04 LAB — DIFFERENTIAL
Basophils Absolute: 0
Basophils Absolute: 0
Basophils Absolute: 0
Basophils Absolute: 0
Basophils Relative: 0
Basophils Relative: 0
Basophils Relative: 0
Eosinophils Absolute: 0.1
Eosinophils Absolute: 0.3
Eosinophils Absolute: 0.3
Eosinophils Relative: 1
Eosinophils Relative: 3
Eosinophils Relative: 4
Lymphocytes Relative: 11 — ABNORMAL LOW
Lymphocytes Relative: 29
Lymphocytes Relative: 34
Lymphocytes Relative: 41
Lymphs Abs: 2
Lymphs Abs: 3.2
Monocytes Absolute: 0.5
Monocytes Absolute: 0.6
Monocytes Absolute: 0.6
Monocytes Absolute: 0.6
Monocytes Relative: 4
Monocytes Relative: 7
Monocytes Relative: 7
Monocytes Relative: 8
Monocytes Relative: 8
Neutro Abs: 3.7
Neutro Abs: 4.6
Neutro Abs: 8.7 — ABNORMAL HIGH
Neutrophils Relative %: 48
Neutrophils Relative %: 59

## 2011-03-04 LAB — BASIC METABOLIC PANEL
BUN: 3 — ABNORMAL LOW
Calcium: 8.3 — ABNORMAL LOW
Chloride: 108
Creatinine, Ser: 1.08
GFR calc Af Amer: >60
GFR calc non Af Amer: >60

## 2011-03-04 LAB — ABO/RH: ABO/RH(D): B POS

## 2011-03-04 LAB — PREPARE RBC (CROSSMATCH)

## 2011-03-04 LAB — POCT CARDIAC MARKERS
CKMB, poc: 1.7
Troponin i, poc: <0.05

## 2011-03-04 LAB — LIPASE, BLOOD: Lipase: 36

## 2011-03-04 LAB — PROTIME-INR
INR: 1
Prothrombin Time: 13.3

## 2011-03-04 LAB — APTT: aPTT: 26

## 2011-09-24 ENCOUNTER — Ambulatory Visit (HOSPITAL_COMMUNITY)
Admission: RE | Admit: 2011-09-24 | Discharge: 2011-09-24 | Disposition: A | Discharge status: Home / Self Care | Payer: Medicare Other | Source: Ambulatory Visit | Attending: Internal Medicine | Admitting: Internal Medicine

## 2011-09-24 ENCOUNTER — Other Ambulatory Visit (HOSPITAL_COMMUNITY): Payer: Self-pay | Admitting: Internal Medicine

## 2011-09-24 DIAGNOSIS — Z0000 Encounter for general adult medical examination without abnormal findings: Secondary | ICD-10-CM | POA: Insufficient documentation

## 2011-09-24 DIAGNOSIS — Z87891 Personal history of nicotine dependence: Secondary | ICD-10-CM | POA: Insufficient documentation

## 2011-11-11 ENCOUNTER — Ambulatory Visit (INDEPENDENT_AMBULATORY_CARE_PROVIDER_SITE_OTHER): Payer: Medicare Other | Admitting: Family Medicine

## 2011-11-11 ENCOUNTER — Encounter: Payer: Self-pay | Admitting: Family Medicine

## 2011-11-11 VITALS — BP 120/70 | HR 107 | Resp 16 | Ht 70.0 in | Wt 227.4 lb

## 2011-11-11 DIAGNOSIS — E669 Obesity, unspecified: Secondary | ICD-10-CM

## 2011-11-11 DIAGNOSIS — I10 Essential (primary) hypertension: Secondary | ICD-10-CM

## 2011-11-11 DIAGNOSIS — E119 Type 2 diabetes mellitus without complications: Secondary | ICD-10-CM

## 2011-11-11 DIAGNOSIS — C61 Malignant neoplasm of prostate: Secondary | ICD-10-CM

## 2011-11-11 DIAGNOSIS — Z8673 Personal history of transient ischemic attack (TIA), and cerebral infarction without residual deficits: Secondary | ICD-10-CM

## 2011-11-11 DIAGNOSIS — E785 Hyperlipidemia, unspecified: Secondary | ICD-10-CM | POA: Insufficient documentation

## 2011-11-11 MED ORDER — FLUTICASONE PROPIONATE 50 MCG/ACT NA SUSP: 2.0000 | Freq: Every day | NASAL | Status: DC

## 2011-11-11 NOTE — Assessment & Plan Note (Signed)
Continue Zocor , obtain most recent labs

## 2011-11-11 NOTE — Patient Instructions (Addendum)
Continue your current medications Use the flonase for your allergies I will get your records from Dr.Ottelin and [facility]  F/U 3 months

## 2011-11-11 NOTE — Progress Notes (Signed)
  Subjective:    Patient ID: [name], male    DOB: April 30, 1941, 71 y.o.   MRN: [id number]  HPI  Pt presents to establish care,previous PCP-[facility]- [facility]- Dr. Jonette Eva  Medications and history reviewed He has been having allergy symptoms, eyes running clear water, sneezing, he uses his wife's flonase and occasionally albuterol when he feels something in his chest. Both of these help. Colonoscopy UTD History of prostate cancer- no active treatments, has appt this month, previous bloodwork has been normal   Review of Systems   GEN- denies fatigue, fever, weight loss,weakness, recent illness HEENT- + eye drainage, change in vision, nasal discharge,+sneezing CVS- denies chest pain, palpitations RESP- denies SOB, cough, wheeze ABD- denies N/V, change in stools, abd pain GU- denies dysuria, hematuria, dribbling, incontinence MSK- denies joint pain, muscle aches, injury Neuro- denies headache, dizziness, syncope, seizure activity      Objective:   Physical Exam GEN- NAD, alert and oriented x3 HEENT- PERRL, EOMI, non injected sclera, pink conjunctiva, MMM, oropharynx clear, bridge Neck- Supple,  CVS- RRR, no murmur RESP-CTAB ABD-NABS,soft, NT,ND EXT- No edema Pulses- Radial, DP- 2+        Assessment & Plan:

## 2011-11-11 NOTE — Assessment & Plan Note (Signed)
No residual problems, maintained on plavix

## 2011-11-11 NOTE — Assessment & Plan Note (Signed)
Borderline per pt, no medications, no CBG monitoring

## 2011-11-11 NOTE — Assessment & Plan Note (Signed)
Obtain records , no active treatment

## 2011-11-11 NOTE — Assessment & Plan Note (Signed)
Blood pressure well controlled

## 2011-12-23 ENCOUNTER — Other Ambulatory Visit: Payer: Self-pay | Admitting: Gastroenterology

## 2011-12-23 NOTE — Telephone Encounter (Signed)
No med listed here.

## 2012-02-11 ENCOUNTER — Encounter: Payer: Self-pay | Admitting: Family Medicine

## 2012-02-11 ENCOUNTER — Ambulatory Visit (INDEPENDENT_AMBULATORY_CARE_PROVIDER_SITE_OTHER): Payer: Medicare Other | Admitting: Family Medicine

## 2012-02-11 VITALS — BP 130/78 | HR 70 | Resp 18 | Ht 70.0 in | Wt 231.0 lb

## 2012-02-11 DIAGNOSIS — E119 Type 2 diabetes mellitus without complications: Secondary | ICD-10-CM

## 2012-02-11 DIAGNOSIS — J309 Allergic rhinitis, unspecified: Secondary | ICD-10-CM

## 2012-02-11 DIAGNOSIS — E669 Obesity, unspecified: Secondary | ICD-10-CM

## 2012-02-11 DIAGNOSIS — K219 Gastro-esophageal reflux disease without esophagitis: Secondary | ICD-10-CM

## 2012-02-11 DIAGNOSIS — Z9109 Other allergy status, other than to drugs and biological substances: Secondary | ICD-10-CM

## 2012-02-11 DIAGNOSIS — I10 Essential (primary) hypertension: Secondary | ICD-10-CM

## 2012-02-11 DIAGNOSIS — E785 Hyperlipidemia, unspecified: Secondary | ICD-10-CM

## 2012-02-11 MED ORDER — FLUTICASONE PROPIONATE 50 MCG/ACT NA SUSP: 2.0000 | Freq: Every day | NASAL | Status: DC

## 2012-02-11 NOTE — Patient Instructions (Addendum)
Get the labs done fasting Continue your current medications You can add a TUMS if you need it  Increase water, add more fruits and veggies to your diet, avoid fried foods  F/U 4 months

## 2012-02-13 ENCOUNTER — Encounter: Payer: Self-pay | Admitting: Family Medicine

## 2012-02-13 DIAGNOSIS — K219 Gastro-esophageal reflux disease without esophagitis: Secondary | ICD-10-CM | POA: Insufficient documentation

## 2012-02-13 DIAGNOSIS — Z9109 Other allergy status, other than to drugs and biological substances: Secondary | ICD-10-CM | POA: Insufficient documentation

## 2012-02-13 NOTE — Assessment & Plan Note (Signed)
Well controlled no change to meds 

## 2012-02-13 NOTE — Assessment & Plan Note (Signed)
Continue statin therapy, check FLP and titrate

## 2012-02-13 NOTE — Assessment & Plan Note (Addendum)
On PPI, additional TUMS as needed, discussed foods to avoid

## 2012-02-13 NOTE — Assessment & Plan Note (Signed)
Check A1C and labs, no meds currently

## 2012-02-13 NOTE — Progress Notes (Signed)
  Subjective:    Patient ID: [name], male    DOB: 1940/06/11, 71 y.o.   MRN: [id number]  HPI  Pt here to f/u chronic medical problems doing well, has some break-through heartburn though on PPI.  Due for labs for A1C. Asking for refill on flonase which helps his stuffiness and feeling SOB out in the yard. Denies CP   Review of Systems   GEN- denies fatigue, fever, weight loss,weakness, recent illness HEENT- denies eye drainage, change in vision, nasal discharge, CVS- denies chest pain, palpitations RESP- denies SOB, cough, wheeze ABD- denies N/V, change in stools, abd pain GU- denies dysuria, hematuria, dribbling, incontinence MSK- denies joint pain, muscle aches, injury Neuro- denies headache, dizziness, syncope, seizure activity      Objective:   Physical Exam GEN- NAD, alert and oriented x3 HEENT- PERRL, EOMI, non injected sclera, pink conjunctiva, MMM, oropharynx clear Neck- Supple, no LAD CVS- RRR, no murmur RESP-CTAB ABD-NABS,soft,NT.ND EXT- No edema Pulses- Radial, DP- 2+ Foot Exam- normal monofilament, thickened nails       Assessment & Plan:

## 2012-02-13 NOTE — Assessment & Plan Note (Signed)
His symptoms occur when out in the yard doing work, refilled flonase, no true cardiorespiratory distress

## 2012-02-13 NOTE — Assessment & Plan Note (Signed)
Deteriorated, gained some weight, exercise, diet discussed

## 2012-02-20 LAB — COMPREHENSIVE METABOLIC PANEL
AST: 16 U/L (ref 0–37)
Albumin: 4.5 g/dL (ref 3.5–5.2)
Alkaline Phosphatase: 80 U/L (ref 39–117)
BUN: 11 mg/dL (ref 6–23)
Calcium: 10.1 mg/dL (ref 8.4–10.5)
Chloride: 105 mEq/L (ref 96–112)
Glucose, Bld: 95 mg/dL (ref 70–99)
Potassium: 4 mEq/L (ref 3.5–5.3)
Sodium: 139 mEq/L (ref 135–145)
Total Protein: 7.3 g/dL (ref 6.0–8.3)

## 2012-02-20 LAB — CBC
HCT: 37 % — ABNORMAL LOW (ref 39.0–52.0)
Hemoglobin: 12.8 g/dL — ABNORMAL LOW (ref 13.0–17.0)
MCHC: 34.6 g/dL (ref 30.0–36.0)
RDW: 13.9 % (ref 11.5–15.5)
WBC: 7.6 10*3/uL (ref 4.0–10.5)

## 2012-02-20 LAB — HEMOGLOBIN A1C
Hgb A1c MFr Bld: 6.4 % — ABNORMAL HIGH (ref <5.7)
Mean Plasma Glucose: 137 mg/dL — ABNORMAL HIGH (ref <117)

## 2012-02-20 LAB — LIPID PANEL
HDL: 24 mg/dL — ABNORMAL LOW (ref >39)
LDL Cholesterol: 69 mg/dL (ref 0–99)

## 2012-06-22 ENCOUNTER — Ambulatory Visit (INDEPENDENT_AMBULATORY_CARE_PROVIDER_SITE_OTHER): Payer: Medicare Other | Admitting: Family Medicine

## 2012-06-22 ENCOUNTER — Encounter: Payer: Self-pay | Admitting: Family Medicine

## 2012-06-22 VITALS — BP 136/80 | HR 85 | Resp 16 | Ht 70.0 in | Wt 237.0 lb

## 2012-06-22 DIAGNOSIS — E669 Obesity, unspecified: Secondary | ICD-10-CM

## 2012-06-22 DIAGNOSIS — C61 Malignant neoplasm of prostate: Secondary | ICD-10-CM

## 2012-06-22 DIAGNOSIS — I10 Essential (primary) hypertension: Secondary | ICD-10-CM

## 2012-06-22 DIAGNOSIS — E119 Type 2 diabetes mellitus without complications: Secondary | ICD-10-CM

## 2012-06-22 DIAGNOSIS — K219 Gastro-esophageal reflux disease without esophagitis: Secondary | ICD-10-CM

## 2012-06-22 LAB — CBC
MCH: 29.3 pg (ref 26.0–34.0)
MCHC: 34.1 g/dL (ref 30.0–36.0)
MCV: 86.2 fL (ref 78.0–100.0)
Platelets: 220 10*3/uL (ref 150–400)
RDW: 13.9 % (ref 11.5–15.5)
WBC: 7.5 10*3/uL (ref 4.0–10.5)

## 2012-06-22 NOTE — Patient Instructions (Addendum)
Get the labs done today  Continue current medicine  We will call with your diabetes medication  Watch your sugar intake, avoid pies and cakes Diabetic shoes F/U 3 months

## 2012-06-23 ENCOUNTER — Encounter: Payer: Self-pay | Admitting: Family Medicine

## 2012-06-23 LAB — BASIC METABOLIC PANEL
BUN: 12 mg/dL (ref 6–23)
Chloride: 108 mEq/L (ref 96–112)
Glucose, Bld: 92 mg/dL (ref 70–99)
Potassium: 3.9 mEq/L (ref 3.5–5.3)

## 2012-06-23 MED ORDER — LISINOPRIL 2.5 MG PO TABS: 2.5000 mg | ORAL_TABLET | Freq: Every day | ORAL | Status: DC

## 2012-06-23 MED ORDER — GLIPIZIDE 5 MG PO TABS: 5.0000 mg | ORAL_TABLET | Freq: Every day | ORAL | Status: DC

## 2012-06-23 NOTE — Assessment & Plan Note (Signed)
Well controlled with protonix 

## 2012-06-23 NOTE — Assessment & Plan Note (Signed)
He is on megace which can lead to elevated sugars, see above

## 2012-06-23 NOTE — Progress Notes (Signed)
  Subjective:    Patient ID: [name], male    DOB: 07/12/1940, 72 y.o.   MRN: [id number]  HPI   Patient here to follow chronic medical problems. He states his blood sugars have been running high they have been 170-189 fasting below is 104. He denies any hypoglycemia. He is not currently on any medications for diabetes mellitus he's also requesting diabetic shoes as he has some pain in his feet when he walks at times. He is followup with urology this year he is continued on Megace Blood pressures have been running good at home   Review of Systems   GEN- denies fatigue, fever, weight loss,weakness, recent illness HEENT- denies eye drainage, change in vision, nasal discharge, CVS- denies chest pain, palpitations RESP- denies SOB, cough, wheeze ABD- denies N/V, change in stools, abd pain GU- denies dysuria, hematuria, dribbling, incontinence MSK- denies joint pain, muscle aches, injury Neuro- denies headache, dizziness, syncope, seizure activity      Objective:   Physical Exam   GEN- NAD, alert and oriented x3 HEENT- PERRL, EOMI, non injected sclera, pink conjunctiva, MMM, oropharynx clear Neck- Supple, no bruit CVS- RRR, no murmur RESP-CTAB ABD-NABS,soft,NT,ND EXT- No edema Pulses- Radial, DP- 2+ Diabetic foot exam below       Assessment & Plan:

## 2012-06-23 NOTE — Assessment & Plan Note (Addendum)
A1c has deteriorated to 6.5% with his elevated fasting and he is not watching his diet as closely I will start him on glipizide once a day Order for diabetic shoes

## 2012-06-23 NOTE — Assessment & Plan Note (Signed)
Blood pressures have been well controlled, he is not on ACEI. Will add 2.5mg  lisinopril

## 2012-06-23 NOTE — Assessment & Plan Note (Signed)
weight gain  weight gain

## 2012-06-29 ENCOUNTER — Telehealth: Payer: Self-pay | Admitting: Family Medicine

## 2012-06-30 ENCOUNTER — Telehealth: Payer: Self-pay | Admitting: Family Medicine

## 2012-06-30 NOTE — Telephone Encounter (Signed)
Clarified meds for wife.

## 2012-06-30 NOTE — Telephone Encounter (Signed)
Clarified for wife which medicine is for diabetes and which is for blood pressure.

## 2012-07-13 ENCOUNTER — Ambulatory Visit (INDEPENDENT_AMBULATORY_CARE_PROVIDER_SITE_OTHER): Payer: Medicare Other | Admitting: Family Medicine

## 2012-07-13 ENCOUNTER — Encounter: Payer: Self-pay | Admitting: Family Medicine

## 2012-07-13 ENCOUNTER — Ambulatory Visit: Payer: Medicare Other | Admitting: Family Medicine

## 2012-07-13 VITALS — BP 120/72 | HR 84 | Resp 18 | Ht 70.0 in | Wt 239.1 lb

## 2012-07-13 DIAGNOSIS — E119 Type 2 diabetes mellitus without complications: Secondary | ICD-10-CM

## 2012-07-13 DIAGNOSIS — J00 Acute nasopharyngitis [common cold]: Secondary | ICD-10-CM

## 2012-07-13 NOTE — Patient Instructions (Addendum)
Continue your cough medicine Start warm salt water gargles Plug up the humidifier at bedtime Blood sugars looks good Keep previous f/u appointment

## 2012-07-13 NOTE — Progress Notes (Signed)
  Subjective:    Patient ID: [name], male    DOB: [birth year], 72 y.o.   MRN: [id number]  HPI Patient presents with cough, sneezing and sore throat for the past 2 days positive sick contact with his wife. No fever no fatigue no nausea vomiting diarrhea he's been taking an over-the-counter cough medication which helps. He also brought his blood sugars with him today he was started on glipizide once a day at her last visit his blood sugars have been 80-140 on average there was an outlier blood sugar 193   Review of Systems - per above   GEN- denies fatigue, fever, weight loss,weakness, recent illness HEENT- denies eye drainage, change in vision, nasal discharge, CVS- denies chest pain, palpitations RESP- denies SOB, +cough, wheeze ABD- denies N/V, change in stools, abd pain Neuro- denies headache, dizziness, syncope, seizure activity      Objective:   Physical Exam GEN- NAD, alert and oriented x3 HEENT- PERRL, EOMI, non injected sclera, pink conjunctiva, MMM, oropharynx mild injection, TM clear bilat no effusion, no maxillary sinus tenderness, nares clear Neck- Supple, no LAD CVS- RRR, no murmur RESP-CTAB EXT- No edema Pulses- Radial 2+         Assessment & Plan:

## 2012-07-14 DIAGNOSIS — J00 Acute nasopharyngitis [common cold]: Secondary | ICD-10-CM | POA: Insufficient documentation

## 2012-07-14 NOTE — Assessment & Plan Note (Signed)
Blood sugars doing well on glipizide daily

## 2012-07-14 NOTE — Assessment & Plan Note (Signed)
Supportive care, OTC cough med, fluids

## 2012-09-22 ENCOUNTER — Ambulatory Visit (INDEPENDENT_AMBULATORY_CARE_PROVIDER_SITE_OTHER): Payer: Medicare Other | Admitting: Family Medicine

## 2012-09-22 ENCOUNTER — Encounter: Payer: Self-pay | Admitting: Family Medicine

## 2012-09-22 VITALS — BP 134/74 | HR 78 | Resp 16 | Wt 234.1 lb

## 2012-09-22 DIAGNOSIS — E785 Hyperlipidemia, unspecified: Secondary | ICD-10-CM

## 2012-09-22 DIAGNOSIS — I10 Essential (primary) hypertension: Secondary | ICD-10-CM

## 2012-09-22 DIAGNOSIS — E119 Type 2 diabetes mellitus without complications: Secondary | ICD-10-CM

## 2012-09-22 DIAGNOSIS — E669 Obesity, unspecified: Secondary | ICD-10-CM

## 2012-09-22 NOTE — Assessment & Plan Note (Signed)
Noted 5lb weight loss, continue to work on diet

## 2012-09-22 NOTE — Patient Instructions (Addendum)
Continue current medications Congratulations on the weight loss! Referral Opthalmology  Referral  Foot doctor  F/U 3 months

## 2012-09-22 NOTE — Assessment & Plan Note (Signed)
Well controlled low dose glipizide Optho referral, podiatry referral

## 2012-09-22 NOTE — Assessment & Plan Note (Signed)
Fairly well controlled, on low dose ACEI, no change to meds

## 2012-09-22 NOTE — Assessment & Plan Note (Signed)
LDL at goal, continue zocor 

## 2012-09-22 NOTE — Progress Notes (Signed)
  Subjective:    Patient ID: [name], male    DOB: [birth year], 72 y.o.   MRN: [id number]  HPI  Pt here to f/u chronic medical problems, no concerns. Tolerating medications, no hypoglycemia, no CBG > 150 fasting. Due for eye exam. Reviewed last set of labs.   Review of Systems   GEN- denies fatigue, fever, weight loss,weakness, recent illness HEENT- denies eye drainage, change in vision, nasal discharge, CVS- denies chest pain, palpitations RESP- denies SOB, cough, wheeze ABD- denies N/V, change in stools, abd pain GU- denies dysuria, hematuria, dribbling, incontinence MSK- denies joint pain, muscle aches, injury Neuro- denies headache, dizziness, syncope, seizure activity      Objective:   Physical Exam GEN- NAD, alert and oriented x3 HEENT- PERRL, EOMI, non injected sclera, pink conjunctiva, MMM, oropharynx clear Neck- Supple, no bruit CVS- RRR, no murmur RESP-CTAB EXT- No edema Pulses- Radial, DP- 2+        Assessment & Plan:

## 2012-11-06 ENCOUNTER — Other Ambulatory Visit: Payer: Self-pay | Admitting: Family Medicine

## 2012-11-17 ENCOUNTER — Encounter: Payer: Self-pay | Admitting: Gastroenterology

## 2012-12-21 ENCOUNTER — Other Ambulatory Visit: Payer: Self-pay | Admitting: Gastroenterology

## 2012-12-22 ENCOUNTER — Ambulatory Visit: Payer: Medicare Other | Admitting: Family Medicine

## 2012-12-22 ENCOUNTER — Encounter: Payer: Self-pay | Admitting: Family Medicine

## 2012-12-22 ENCOUNTER — Ambulatory Visit (INDEPENDENT_AMBULATORY_CARE_PROVIDER_SITE_OTHER): Payer: Medicare Other | Admitting: Family Medicine

## 2012-12-22 VITALS — BP 140/80 | HR 88 | Temp 97.6°F | Resp 20 | Wt 230.0 lb

## 2012-12-22 DIAGNOSIS — I10 Essential (primary) hypertension: Secondary | ICD-10-CM

## 2012-12-22 DIAGNOSIS — C61 Malignant neoplasm of prostate: Secondary | ICD-10-CM

## 2012-12-22 DIAGNOSIS — E119 Type 2 diabetes mellitus without complications: Secondary | ICD-10-CM

## 2012-12-22 DIAGNOSIS — E785 Hyperlipidemia, unspecified: Secondary | ICD-10-CM

## 2012-12-22 LAB — CBC WITH DIFFERENTIAL/PLATELET
Basophils Absolute: 0 10*3/uL (ref 0.0–0.1)
Basophils Relative: 0 % (ref 0–1)
Eosinophils Absolute: 0.3 10*3/uL (ref 0.0–0.7)
HCT: 35.8 % — ABNORMAL LOW (ref 39.0–52.0)
MCH: 29.2 pg (ref 26.0–34.0)
MCHC: 34.4 g/dL (ref 30.0–36.0)
Monocytes Absolute: 0.6 10*3/uL (ref 0.1–1.0)
Neutro Abs: 3.3 10*3/uL (ref 1.7–7.7)
Neutrophils Relative %: 47 % (ref 43–77)
RDW: 13.6 % (ref 11.5–15.5)

## 2012-12-22 LAB — LIPID PANEL
LDL Cholesterol: 83 mg/dL (ref 0–99)
VLDL: 14 mg/dL (ref 0–40)

## 2012-12-22 LAB — COMPREHENSIVE METABOLIC PANEL
ALT: 13 U/L (ref 0–53)
AST: 17 U/L (ref 0–37)
Albumin: 4.1 g/dL (ref 3.5–5.2)
Calcium: 9.9 mg/dL (ref 8.4–10.5)
Chloride: 105 mEq/L (ref 96–112)
Potassium: 4.5 mEq/L (ref 3.5–5.3)
Sodium: 138 mEq/L (ref 135–145)

## 2012-12-22 LAB — HEMOGLOBIN A1C: Mean Plasma Glucose: 120 mg/dL — ABNORMAL HIGH (ref <117)

## 2012-12-22 MED ORDER — MEGESTROL ACETATE 40 MG PO TABS: 40.0000 mg | ORAL_TABLET | Freq: Two times a day (BID) | ORAL | Status: DC

## 2012-12-22 NOTE — Assessment & Plan Note (Signed)
Reviewed note, Megace refilled, follow-up urology

## 2012-12-22 NOTE — Assessment & Plan Note (Signed)
BP stable, no change to meds

## 2012-12-22 NOTE — Progress Notes (Signed)
  Subjective:    Patient ID: [name], male    DOB: 12-Feb-1941, 72 y.o.   MRN: [id number]  HPI Patient here to follow chronic medical problems. He has no specific concerns. He has his information from his last urology visit. His PSA is elevated to 13.89 he was started on Casodex. He needs his medications refilled directions take twice a day per his urologist. Diabetes mellitus he is tolerating his glipizide he has not had any hypoglycemic episodes he is due for A1c. He is being followed by the eye doctor as well as the podiatry  Immunizations up-to-date   Review of Systems   GEN- denies fatigue, fever, weight loss,weakness, recent illness HEENT- denies eye drainage, change in vision, nasal discharge, CVS- denies chest pain, palpitations RESP- denies SOB, cough, wheeze ABD- denies N/V, change in stools, abd pain GU- denies dysuria, hematuria, dribbling, incontinence MSK- denies joint pain, muscle aches, injury Neuro- denies headache, dizziness, syncope, seizure activity      Objective:   Physical Exam  GEN- NAD, alert and oriented x3 HEENT- PERRL, EOMI, non injected sclera, pink conjunctiva, MMM, oropharynx clear Neck- Supple, no bruit CVS- RRR, no murmur RESP-CTAB EXT- No edema Pulses- Radial, DP- 2+      Assessment & Plan:

## 2012-12-22 NOTE — Assessment & Plan Note (Signed)
Check FLP on statin therapy, check LFT

## 2012-12-22 NOTE — Assessment & Plan Note (Signed)
Obtain A1C, urine micro, on ACEI

## 2012-12-22 NOTE — Patient Instructions (Signed)
Please get Release of Records from [facility]- Dr. Doretha Imus  [phone] (Phone)- Need last eye note We will call with lab results  Continue current medications F/U 3 months

## 2013-01-06 ENCOUNTER — Telehealth: Payer: Self-pay | Admitting: Family Medicine

## 2013-01-06 NOTE — Telephone Encounter (Signed)
LMTRC

## 2013-01-07 NOTE — Telephone Encounter (Signed)
Called pt wife and she had stated that pt had BP has been up some and wanted dto know which one of his meds were BP meds. We went over entire med list told her he was on two BP meds, she wanted to be sure she is giving him the right ones.

## 2013-02-03 ENCOUNTER — Other Ambulatory Visit: Payer: Self-pay | Admitting: Family Medicine

## 2013-03-04 ENCOUNTER — Other Ambulatory Visit: Payer: Self-pay | Admitting: Family Medicine

## 2013-03-04 NOTE — Telephone Encounter (Signed)
Medication refilled per protocol. 

## 2013-03-19 ENCOUNTER — Other Ambulatory Visit: Payer: Self-pay | Admitting: Family Medicine

## 2013-03-19 LAB — CBC WITH DIFFERENTIAL/PLATELET
Basophils Absolute: 0 10*3/uL (ref 0.0–0.1)
Eosinophils Relative: 6 % — ABNORMAL HIGH (ref 0–5)
Lymphocytes Relative: 41 % (ref 12–46)
Lymphs Abs: 2.6 10*3/uL (ref 0.7–4.0)
MCV: 85 fL (ref 78.0–100.0)
Neutro Abs: 2.8 10*3/uL (ref 1.7–7.7)
Neutrophils Relative %: 44 % (ref 43–77)
Platelets: 222 10*3/uL (ref 150–400)
RBC: 4.12 MIL/uL — ABNORMAL LOW (ref 4.22–5.81)
RDW: 13.5 % (ref 11.5–15.5)
WBC: 6.4 10*3/uL (ref 4.0–10.5)

## 2013-03-20 LAB — COMPREHENSIVE METABOLIC PANEL
ALT: 13 U/L (ref 0–53)
AST: 17 U/L (ref 0–37)
BUN: 13 mg/dL (ref 6–23)
CO2: 24 mEq/L (ref 19–32)
Creat: 1.17 mg/dL (ref 0.50–1.35)
Sodium: 138 mEq/L (ref 135–145)
Total Bilirubin: 0.4 mg/dL (ref 0.3–1.2)

## 2013-03-20 LAB — LIPID PANEL
Cholesterol: 120 mg/dL (ref 0–200)
Total CHOL/HDL Ratio: 3.4 Ratio
VLDL: 11 mg/dL (ref 0–40)

## 2013-03-20 LAB — HEMOGLOBIN A1C
Hgb A1c MFr Bld: 6 % — ABNORMAL HIGH (ref <5.7)
Mean Plasma Glucose: 126 mg/dL — ABNORMAL HIGH (ref <117)

## 2013-03-20 LAB — MICROALBUMIN / CREATININE URINE RATIO
Microalb Creat Ratio: 7.9 mg/g (ref 0.0–30.0)
Microalb, Ur: 1.88 mg/dL (ref 0.00–1.89)

## 2013-03-22 ENCOUNTER — Encounter: Payer: Self-pay | Admitting: Family Medicine

## 2013-03-22 ENCOUNTER — Ambulatory Visit (INDEPENDENT_AMBULATORY_CARE_PROVIDER_SITE_OTHER): Payer: Medicare Other | Admitting: Family Medicine

## 2013-03-22 VITALS — BP 130/70 | HR 82 | Temp 97.2°F | Resp 18 | Wt 231.0 lb

## 2013-03-22 DIAGNOSIS — I10 Essential (primary) hypertension: Secondary | ICD-10-CM

## 2013-03-22 DIAGNOSIS — E119 Type 2 diabetes mellitus without complications: Secondary | ICD-10-CM

## 2013-03-22 DIAGNOSIS — E785 Hyperlipidemia, unspecified: Secondary | ICD-10-CM

## 2013-03-22 NOTE — Assessment & Plan Note (Signed)
Well controlled On low dose ACEI and statin Urine micro neg Obtain last eye doctor note Declines flu shot

## 2013-03-22 NOTE — Patient Instructions (Addendum)
Release of records- [facility] in [place] - Dr. Doretha Imus [phone] Labs look good F/U 4 months

## 2013-03-22 NOTE — Assessment & Plan Note (Signed)
LDL at goal 

## 2013-03-22 NOTE — Assessment & Plan Note (Signed)
Well controlled, no change to meds 

## 2013-03-22 NOTE — Progress Notes (Signed)
  Subjective:    Patient ID: [name], male    DOB: 21-Apr-1941, 72 y.o.   MRN: [id number]  HPI  Patient here to follow chronic medical problems as well as his labs. He has no specific concerns. She still being followed by urology for prostate cancer he recently had elevated PSA and will return on Wednesday to see what changes will be done and history. Labs reviewed He declines flu Diabetes mellitus well controlled. He denies any hypoglycemia.Does not have any readings with him. Seen by eye doctor   Review of Systems  GEN- denies fatigue, fever, weight loss,weakness, recent illness HEENT- denies eye drainage, change in vision, nasal discharge, CVS- denies chest pain, palpitations RESP- denies SOB, cough, wheeze ABD- denies N/V, change in stools, abd pain GU- denies dysuria, hematuria, dribbling, incontinence MSK- denies joint pain, muscle aches, injury Neuro- denies headache, dizziness, syncope, seizure activity      Objective:   Physical Exam  GEN- NAD, alert and oriented x3 HEENT- PERRL, EOMI, non injected sclera, pink conjunctiva, MMM, oropharynx clear CVS- RRR, no murmur RESP-CTAB ABD-NABS,soft,NT,ND EXT- No edema Pulses- Radial, DP- 2+        Assessment & Plan:

## 2013-03-26 ENCOUNTER — Ambulatory Visit: Payer: Medicare Other | Admitting: Family Medicine

## 2013-03-31 ENCOUNTER — Other Ambulatory Visit: Payer: Self-pay | Admitting: Family Medicine

## 2013-03-31 MED ORDER — SIMVASTATIN 10 MG PO TABS: 10.0000 mg | ORAL_TABLET | Freq: Every day | ORAL | Status: DC

## 2013-03-31 NOTE — Telephone Encounter (Signed)
Meds refilled.

## 2013-05-03 ENCOUNTER — Ambulatory Visit (INDEPENDENT_AMBULATORY_CARE_PROVIDER_SITE_OTHER): Payer: Medicare Other | Admitting: Family Medicine

## 2013-05-03 ENCOUNTER — Encounter: Payer: Self-pay | Admitting: Family Medicine

## 2013-05-03 VITALS — BP 128/70 | HR 68 | Temp 99.0°F | Resp 18 | Ht 68.0 in | Wt 234.0 lb

## 2013-05-03 DIAGNOSIS — J069 Acute upper respiratory infection, unspecified: Secondary | ICD-10-CM | POA: Insufficient documentation

## 2013-05-03 MED ORDER — ALBUTEROL SULFATE HFA 108 (90 BASE) MCG/ACT IN AERS: 2.0000 | INHALATION_SPRAY | RESPIRATORY_TRACT | Status: AC | PRN

## 2013-05-03 MED ORDER — AMLODIPINE BESYLATE 5 MG PO TABS: 5.0000 mg | ORAL_TABLET | Freq: Every day | ORAL | Status: DC

## 2013-05-03 MED ORDER — AZITHROMYCIN 250 MG PO TABS: ORAL_TABLET | ORAL | Status: DC

## 2013-05-03 NOTE — Progress Notes (Signed)
   Subjective:    Patient ID: [name], male    DOB: 1941-04-10, 72 y.o.   MRN: [id number]  HPI Patient here with cough with production for the past week which is worsening. He states that the cough keeps him up at night. Is productive of yellow sputum. He's had some wheezing some nights. He did have an old albuterol inhaler which she used which helps but is out of this. He denies any chest pain or shortness of breath. He is currently on treatment for his prostate cancer. He has been taking Coricidan OTC   Review of Systems  GEN- denies fatigue, fever, weight loss,weakness, recent illness HEENT- denies eye drainage, change in vision, nasal discharge, +post nasal drip CVS- denies chest pain, palpitations RESP- denies SOB, +cough, +wheeze Neuro- denies headache, dizziness, syncope, seizure activity      Objective:   Physical Exam   GEN- NAD, alert and oriented x3 HEENT- PERRL, EOMI, non injected sclera, pink conjunctiva, MMM, oropharynx mild injection, TM clear bilat no effusion, no maxillary sinus tenderness,nares clear Neck- Supple, shotty LAD CVS- RRR, no murmur RESP-upper airway congestion EXT- No edema Pulses- Radial 2+        Assessment & Plan:

## 2013-05-03 NOTE — Assessment & Plan Note (Signed)
Great deal with mostly upper respiratory infection he states that he does have some wheezing which makes me concerned he may be developing some early bronchitis. He can use the albuterol as needed. I will also place him on azithromycin. Cough syrup to be continueD

## 2013-05-03 NOTE — Patient Instructions (Addendum)
Take the antibiotics as prescribed  Use the albuterol as prescribed Okay to use the cough  F/u as needed

## 2013-06-22 ENCOUNTER — Other Ambulatory Visit: Payer: Self-pay | Admitting: Gastroenterology

## 2013-06-22 ENCOUNTER — Other Ambulatory Visit: Payer: Self-pay | Admitting: Family Medicine

## 2013-06-22 NOTE — Telephone Encounter (Signed)
Medication refilled per protocol. 

## 2013-06-29 ENCOUNTER — Other Ambulatory Visit: Payer: Self-pay | Admitting: Family Medicine

## 2013-06-29 NOTE — Telephone Encounter (Signed)
Medication refilled per protocol. 

## 2013-07-15 ENCOUNTER — Ambulatory Visit (INDEPENDENT_AMBULATORY_CARE_PROVIDER_SITE_OTHER): Payer: Medicare Other | Admitting: Family Medicine

## 2013-07-15 ENCOUNTER — Encounter: Payer: Self-pay | Admitting: Family Medicine

## 2013-07-15 ENCOUNTER — Ambulatory Visit (HOSPITAL_COMMUNITY)
Admission: RE | Admit: 2013-07-15 | Discharge: 2013-07-15 | Disposition: A | Discharge status: Home / Self Care | Payer: Medicare Other | Source: Ambulatory Visit | Attending: Family Medicine | Admitting: Family Medicine

## 2013-07-15 VITALS — BP 100/68 | HR 86 | Temp 98.3°F | Resp 18 | Ht 68.0 in | Wt 222.0 lb

## 2013-07-15 DIAGNOSIS — R112 Nausea with vomiting, unspecified: Secondary | ICD-10-CM

## 2013-07-15 LAB — CBC WITH DIFFERENTIAL/PLATELET
Basophils Absolute: 0 10*3/uL (ref 0.0–0.1)
Basophils Relative: 0 % (ref 0–1)
Eosinophils Absolute: 0.1 10*3/uL (ref 0.0–0.7)
Eosinophils Relative: 1 % (ref 0–5)
HEMATOCRIT: 37.9 % — AB (ref 39.0–52.0)
Hemoglobin: 13.2 g/dL (ref 13.0–17.0)
Lymphocytes Relative: 39 % (ref 12–46)
Lymphs Abs: 2.3 10*3/uL (ref 0.7–4.0)
MCH: 29.3 pg (ref 26.0–34.0)
MCHC: 34.8 g/dL (ref 30.0–36.0)
MCV: 84.2 fL (ref 78.0–100.0)
MONO ABS: 0.6 10*3/uL (ref 0.1–1.0)
MONOS PCT: 10 % (ref 3–12)
NEUTROS PCT: 50 % (ref 43–77)
Neutro Abs: 3.1 10*3/uL (ref 1.7–7.7)
Platelets: 215 10*3/uL (ref 150–400)
RBC: 4.5 MIL/uL (ref 4.22–5.81)
RDW: 13.9 % (ref 11.5–15.5)
WBC: 6.1 10*3/uL (ref 4.0–10.5)

## 2013-07-15 LAB — COMPLETE METABOLIC PANEL WITH GFR
ALBUMIN: 4 g/dL (ref 3.5–5.2)
ALT: 18 U/L (ref 0–53)
AST: 26 U/L (ref 0–37)
Alkaline Phosphatase: 99 U/L (ref 39–117)
BUN: 14 mg/dL (ref 6–23)
CO2: 21 meq/L (ref 19–32)
Calcium: 9.6 mg/dL (ref 8.4–10.5)
Chloride: 103 mEq/L (ref 96–112)
Creat: 1.08 mg/dL (ref 0.50–1.35)
GFR, EST AFRICAN AMERICAN: 79 mL/min
GFR, EST NON AFRICAN AMERICAN: 68 mL/min
GLUCOSE: 117 mg/dL — AB (ref 70–99)
POTASSIUM: 4.1 meq/L (ref 3.5–5.3)
Sodium: 137 mEq/L (ref 135–145)
TOTAL PROTEIN: 7.1 g/dL (ref 6.0–8.3)
Total Bilirubin: 0.8 mg/dL (ref 0.2–1.2)

## 2013-07-15 LAB — LIPASE: LIPASE: 29 U/L (ref 0–75)

## 2013-07-15 MED ORDER — METOCLOPRAMIDE HCL 10 MG PO TABS: 10.0000 mg | ORAL_TABLET | Freq: Three times a day (TID) | ORAL | Status: DC

## 2013-07-15 NOTE — Progress Notes (Signed)
Subjective:    Patient ID: [name], male    DOB: Jul 25, 1940, 73 y.o.   MRN: [id number]  HPI  Patient has a one-week history of inability to tolerate PO.  Patient has been constantly nauseated for the last week. He has vomited numerous times every day. As soon as he eats anything he feels the urge to vomit. He is also had several episodes of post tussive emesis.  He denies any fever. He denies any diarrhea. He denies any abdominal pain or bloating.  He has had some mild coughing. He has history of diabetes although it is well controlled. He also has a history of metastatic prostate cancer and stroke. He has not had a bowel movement in the last 4 days. Past Medical History  Diagnosis Date  . Prostate cancer SEP 2011 BONE METS  . CVA (cerebral vascular accident)     off coumadin  . DM (diabetes mellitus)   . HTN (hypertension)   . Diverticulosis 10/09    LGIB  . Schatzki's ring OCT 2009  . Colon polyp OCT 2009  . Mallory - Weiss tear OCT 2009    AFTER VOMITING FROM [place]  . Internal hemorrhoid    Current Outpatient Prescriptions on File Prior to Visit  Medication Sig Dispense Refill  . albuterol (VENTOLIN HFA) 108 (90 BASE) MCG/ACT inhaler Inhale 2 puffs into the lungs every 4 (four) hours as needed for wheezing or shortness of breath.  1 Inhaler  3  . amLODipine (NORVASC) 5 MG tablet Take 1 tablet (5 mg total) by mouth daily.  30 tablet  6  . bicalutamide (CASODEX) 50 MG tablet Take 50 mg by mouth daily.      . clopidogrel (PLAVIX) 75 MG tablet Take 75 mg by mouth daily.        . enzalutamide (XTANDI) 40 MG capsule Take 80 mg by mouth 2 (two) times daily.      . fish oil-omega-3 fatty acids 1000 MG capsule Take 2 g by mouth daily.        . fluticasone (FLONASE) 50 MCG/ACT nasal spray Place 2 sprays into the nose daily.  16 g  5  . glipiZIDE (GLUCOTROL) 5 MG tablet TAKE (1) TABLET BY MOUTH ONCE DAILY.  30 tablet  1  . lisinopril (PRINIVIL,ZESTRIL) 2.5 MG tablet TAKE 1 TABLET  ONCE DAILY.  30 tablet  5  . megestrol (MEGACE) 40 MG tablet Take 1 tablet (40 mg total) by mouth 2 (two) times daily.  60 tablet  11  . pantoprazole (PROTONIX) 40 MG tablet TAKE 1 TABLET DAILY 30 MINUTES PRIOR TO BREAKFAST--DO NOT TAKE WITH PRILOSEC.  30 tablet  5  . simvastatin (ZOCOR) 10 MG tablet TAKE ONE TABLET BY MOUTH AT BEDTIME.  30 tablet  1   No current facility-administered medications on file prior to visit.   No Known Allergies History   Social History  . Marital Status: Married    Spouse Name: N/A    Number of Children: N/A  . Years of Education: N/A   Occupational History  . Not on file.   Social History Main Topics  . Smoking status: Former Smoker    Types: Cigarettes  . Smokeless tobacco: Former Systems developer     Comment: three cigarettas a day  . Alcohol Use: No  . Drug Use: No  . Sexual Activity: Not on file   Other Topics Concern  . Not on file   Social History Narrative  . No narrative  on file     Review of Systems  All other systems reviewed and are negative.       Objective:   Physical Exam  Vitals reviewed. Constitutional: He appears well-developed and well-nourished.  Neck: Neck supple.  Cardiovascular: Normal rate, regular rhythm and normal heart sounds.  Exam reveals no gallop and no friction rub.   No murmur heard. Pulmonary/Chest: Effort normal and breath sounds normal. No respiratory distress. He has no wheezes. He has no rales. He exhibits no tenderness.  Abdominal: Soft. He exhibits no distension and no mass. Bowel sounds are absent. There is no tenderness. There is no rebound and no guarding.  Musculoskeletal: He exhibits no edema.  Lymphadenopathy:    He has no cervical adenopathy.          Assessment & Plan:  1. Nausea with vomiting The differential diagnosis includes viral gastroenteritis, small bowel obstruction, gastroparesis, pancreatitis. Given the fact the patient is not tolerating oral intake very well, I've recommended  that he temporarily discontinue lisinopril and glipizide. I recommend a BRAT diet. I will start Reglan 10 mg by mouth at 3 times a day. This should help gastroenteritis or gastroparesis. I will obtain lab work to rule out pancreatitis or metabolic acidosis or signs of infection. I will obtain abdominal x-rays rule out small bowel obstruction.  Recheck on Monday or immediately if worse. - COMPLETE METABOLIC PANEL WITH GFR - CBC with Differential - Lipase - metoCLOPramide (REGLAN) 10 MG tablet; Take 1 tablet (10 mg total) by mouth 4 (four) times daily -  before meals and at bedtime.  Dispense: 30 tablet; Refill: 0 - DG Abd 2 Views; Future

## 2013-07-16 ENCOUNTER — Encounter: Payer: Self-pay | Admitting: *Deleted

## 2013-07-19 ENCOUNTER — Ambulatory Visit (HOSPITAL_COMMUNITY)
Admission: RE | Admit: 2013-07-19 | Discharge: 2013-07-19 | Disposition: A | Discharge status: Home / Self Care | Payer: Medicare Other | Source: Ambulatory Visit | Attending: Family Medicine | Admitting: Family Medicine

## 2013-07-19 ENCOUNTER — Ambulatory Visit (INDEPENDENT_AMBULATORY_CARE_PROVIDER_SITE_OTHER): Payer: Medicare Other | Admitting: Family Medicine

## 2013-07-19 ENCOUNTER — Encounter: Payer: Self-pay | Admitting: Family Medicine

## 2013-07-19 VITALS — BP 118/68 | HR 78 | Temp 97.7°F | Resp 18 | Ht 68.0 in | Wt 218.0 lb

## 2013-07-19 DIAGNOSIS — R059 Cough, unspecified: Secondary | ICD-10-CM

## 2013-07-19 DIAGNOSIS — C61 Malignant neoplasm of prostate: Secondary | ICD-10-CM

## 2013-07-19 DIAGNOSIS — R05 Cough: Secondary | ICD-10-CM

## 2013-07-19 DIAGNOSIS — R109 Unspecified abdominal pain: Secondary | ICD-10-CM | POA: Insufficient documentation

## 2013-07-19 DIAGNOSIS — C7952 Secondary malignant neoplasm of bone marrow: Secondary | ICD-10-CM

## 2013-07-19 DIAGNOSIS — C7951 Secondary malignant neoplasm of bone: Secondary | ICD-10-CM

## 2013-07-19 DIAGNOSIS — R112 Nausea with vomiting, unspecified: Secondary | ICD-10-CM

## 2013-07-19 DIAGNOSIS — Z8546 Personal history of malignant neoplasm of prostate: Secondary | ICD-10-CM | POA: Insufficient documentation

## 2013-07-19 DIAGNOSIS — J984 Other disorders of lung: Secondary | ICD-10-CM | POA: Insufficient documentation

## 2013-07-19 LAB — CBC WITH DIFFERENTIAL/PLATELET
BASOS ABS: 0 10*3/uL (ref 0.0–0.1)
BASOS PCT: 0 % (ref 0–1)
Eosinophils Absolute: 0.1 10*3/uL (ref 0.0–0.7)
Eosinophils Relative: 2 % (ref 0–5)
HCT: 37.3 % — ABNORMAL LOW (ref 39.0–52.0)
Hemoglobin: 12.9 g/dL — ABNORMAL LOW (ref 13.0–17.0)
Lymphocytes Relative: 33 % (ref 12–46)
Lymphs Abs: 2.2 10*3/uL (ref 0.7–4.0)
MCH: 29.6 pg (ref 26.0–34.0)
MCHC: 34.6 g/dL (ref 30.0–36.0)
MCV: 85.6 fL (ref 78.0–100.0)
Monocytes Absolute: 0.6 10*3/uL (ref 0.1–1.0)
Monocytes Relative: 9 % (ref 3–12)
NEUTROS PCT: 56 % (ref 43–77)
Neutro Abs: 3.8 10*3/uL (ref 1.7–7.7)
PLATELETS: 254 10*3/uL (ref 150–400)
RBC: 4.36 MIL/uL (ref 4.22–5.81)
RDW: 13.3 % (ref 11.5–15.5)
WBC: 6.7 10*3/uL (ref 4.0–10.5)

## 2013-07-19 LAB — URINALYSIS, MICROSCOPIC ONLY
Crystals: NONE SEEN
RBC / HPF: NONE SEEN RBC/hpf (ref <3)
Squamous Epithelial / LPF: NONE SEEN

## 2013-07-19 LAB — COMPREHENSIVE METABOLIC PANEL
ALT: 19 U/L (ref 0–53)
AST: 20 U/L (ref 0–37)
Albumin: 3.9 g/dL (ref 3.5–5.2)
Alkaline Phosphatase: 102 U/L (ref 39–117)
BUN: 10 mg/dL (ref 6–23)
CALCIUM: 9.4 mg/dL (ref 8.4–10.5)
CO2: 23 mEq/L (ref 19–32)
Chloride: 103 mEq/L (ref 96–112)
Creat: 1.21 mg/dL (ref 0.50–1.35)
Glucose, Bld: 180 mg/dL — ABNORMAL HIGH (ref 70–99)
POTASSIUM: 3.9 meq/L (ref 3.5–5.3)
Sodium: 138 mEq/L (ref 135–145)
TOTAL PROTEIN: 6.9 g/dL (ref 6.0–8.3)
Total Bilirubin: 0.7 mg/dL (ref 0.2–1.2)

## 2013-07-19 LAB — URINALYSIS, ROUTINE W REFLEX MICROSCOPIC
Glucose, UA: 100 mg/dL — AB
Hgb urine dipstick: NEGATIVE
Ketones, ur: NEGATIVE mg/dL
Leukocytes, UA: NEGATIVE
Nitrite: NEGATIVE
Protein, ur: 100 mg/dL — AB
Specific Gravity, Urine: 1.025 (ref 1.005–1.030)
Urobilinogen, UA: 4 mg/dL — ABNORMAL HIGH (ref 0.0–1.0)
pH: 6.5 (ref 5.0–8.0)

## 2013-07-19 MED ORDER — IOHEXOL 300 MG/ML  SOLN
100.0000 mL | Freq: Once | INTRAMUSCULAR | Status: AC | PRN
  Administered 2013-07-19: 100 mL via INTRAVENOUS

## 2013-07-19 MED ORDER — ONDANSETRON 4 MG PO TBDP: 4.0000 mg | ORAL_TABLET | Freq: Three times a day (TID) | ORAL | Status: DC | PRN
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
## 2013-07-19 NOTE — Patient Instructions (Addendum)
Do not take lisinopril or glipzide Stop the reglan Start zofran CT scan of abdomen pelvis to be done

## 2013-07-20 ENCOUNTER — Emergency Department (HOSPITAL_COMMUNITY)
Admission: EM | Admit: 2013-07-20 | Discharge: 2013-07-20 | Disposition: A | Discharge status: Home / Self Care | Payer: Medicare Other | Attending: Emergency Medicine | Admitting: Emergency Medicine

## 2013-07-20 ENCOUNTER — Encounter (HOSPITAL_COMMUNITY): Payer: Self-pay | Admitting: Emergency Medicine

## 2013-07-20 DIAGNOSIS — K5289 Other specified noninfective gastroenteritis and colitis: Secondary | ICD-10-CM | POA: Insufficient documentation

## 2013-07-20 DIAGNOSIS — Z87891 Personal history of nicotine dependence: Secondary | ICD-10-CM | POA: Insufficient documentation

## 2013-07-20 DIAGNOSIS — K529 Noninfective gastroenteritis and colitis, unspecified: Secondary | ICD-10-CM

## 2013-07-20 DIAGNOSIS — Z860100 Personal history of colon polyps, unspecified: Secondary | ICD-10-CM | POA: Insufficient documentation

## 2013-07-20 DIAGNOSIS — Q391 Atresia of esophagus with tracheo-esophageal fistula: Secondary | ICD-10-CM | POA: Insufficient documentation

## 2013-07-20 DIAGNOSIS — Z8673 Personal history of transient ischemic attack (TIA), and cerebral infarction without residual deficits: Secondary | ICD-10-CM | POA: Insufficient documentation

## 2013-07-20 DIAGNOSIS — Q393 Congenital stenosis and stricture of esophagus: Secondary | ICD-10-CM

## 2013-07-20 DIAGNOSIS — Z8546 Personal history of malignant neoplasm of prostate: Secondary | ICD-10-CM | POA: Insufficient documentation

## 2013-07-20 DIAGNOSIS — E119 Type 2 diabetes mellitus without complications: Secondary | ICD-10-CM | POA: Insufficient documentation

## 2013-07-20 DIAGNOSIS — Z79899 Other long term (current) drug therapy: Secondary | ICD-10-CM | POA: Insufficient documentation

## 2013-07-20 DIAGNOSIS — Z7902 Long term (current) use of antithrombotics/antiplatelets: Secondary | ICD-10-CM | POA: Insufficient documentation

## 2013-07-20 DIAGNOSIS — I10 Essential (primary) hypertension: Secondary | ICD-10-CM | POA: Insufficient documentation

## 2013-07-20 DIAGNOSIS — R112 Nausea with vomiting, unspecified: Secondary | ICD-10-CM

## 2013-07-20 LAB — BASIC METABOLIC PANEL
BUN: 8 mg/dL (ref 6–23)
CO2: 22 meq/L (ref 19–32)
CREATININE: 1.1 mg/dL (ref 0.50–1.35)
Calcium: 9.5 mg/dL (ref 8.4–10.5)
Chloride: 98 mEq/L (ref 96–112)
GFR calc Af Amer: 75 mL/min — ABNORMAL LOW (ref >90)
GFR calc non Af Amer: 65 mL/min — ABNORMAL LOW (ref >90)
Glucose, Bld: 142 mg/dL — ABNORMAL HIGH (ref 70–99)
Potassium: 3.4 mEq/L — ABNORMAL LOW (ref 3.7–5.3)
Sodium: 135 mEq/L — ABNORMAL LOW (ref 137–147)

## 2013-07-20 MED ORDER — PROMETHAZINE HCL 25 MG PO TABS: 25.0000 mg | ORAL_TABLET | Freq: Four times a day (QID) | ORAL | Status: DC | PRN

## 2013-07-20 MED ORDER — ONDANSETRON HCL 4 MG/2ML IJ SOLN
4.0000 mg | Freq: Once | INTRAMUSCULAR | Status: AC
  Administered 2013-07-20: 4 mg via INTRAVENOUS
  Filled 2013-07-20: qty 2

## 2013-07-20 MED ORDER — SODIUM CHLORIDE 0.9 % IV SOLN
Freq: Once | INTRAVENOUS | Status: AC
  Administered 2013-07-20: 11:00:00 via INTRAVENOUS

## 2013-07-20 NOTE — Discharge Instructions (Signed)
Phenergan as prescribed as needed for nausea.  Return to the emergency department for severe abdominal pain, bloody, or other new or concerning symptoms.   Viral Gastroenteritis Viral gastroenteritis is also known as stomach flu. This condition affects the stomach and intestinal tract. It can cause sudden diarrhea and vomiting. The illness typically lasts 3 to 8 days. Most people develop an immune response that eventually gets rid of the virus. While this natural response develops, the virus can make you quite ill. CAUSES  Many different viruses can cause gastroenteritis, such as rotavirus or noroviruses. You can catch one of these viruses by consuming contaminated food or water. You may also catch a virus by sharing utensils or other personal items with an infected person or by touching a contaminated surface. SYMPTOMS  The most common symptoms are diarrhea and vomiting. These problems can cause a severe loss of body fluids (dehydration) and a body salt (electrolyte) imbalance. Other symptoms may include:  Fever.  Headache.  Fatigue.  Abdominal pain. DIAGNOSIS  Your caregiver can usually diagnose viral gastroenteritis based on your symptoms and a physical exam. A stool sample may also be taken to test for the presence of viruses or other infections. TREATMENT  This illness typically goes away on its own. Treatments are aimed at rehydration. The most serious cases of viral gastroenteritis involve vomiting so severely that you are not able to keep fluids down. In these cases, fluids must be given through an intravenous line (IV). HOME CARE INSTRUCTIONS   Drink enough fluids to keep your urine clear or pale yellow. Drink small amounts of fluids frequently and increase the amounts as tolerated.  Ask your caregiver for specific rehydration instructions.  Avoid:  Foods high in sugar.  Alcohol.  Carbonated drinks.  Tobacco.  Juice.  Caffeine drinks.  Extremely hot or cold  fluids.  Fatty, greasy foods.  Too much intake of anything at one time.  Dairy products until 24 to 48 hours after diarrhea stops.  You may consume probiotics. Probiotics are active cultures of beneficial bacteria. They may lessen the amount and number of diarrheal stools in adults. Probiotics can be found in yogurt with active cultures and in supplements.  Wash your hands well to avoid spreading the virus.  Only take over-the-counter or prescription medicines for pain, discomfort, or fever as directed by your caregiver. Do not give aspirin to children. Antidiarrheal medicines are not recommended.  Ask your caregiver if you should continue to take your regular prescribed and over-the-counter medicines.  Keep all follow-up appointments as directed by your caregiver. SEEK IMMEDIATE MEDICAL CARE IF:   You are unable to keep fluids down.  You do not urinate at least once every 6 to 8 hours.  You develop shortness of breath.  You notice blood in your stool or vomit. This may look like coffee grounds.  You have abdominal pain that increases or is concentrated in one small area (localized).  You have persistent vomiting or diarrhea.  You have a fever.  The patient is a child younger than 3 months, and he or she has a fever.  The patient is a child older than 3 months, and he or she has a fever and persistent symptoms.  The patient is a child older than 3 months, and he or she has a fever and symptoms suddenly get worse.  The patient is a baby, and he or she has no tears when crying. MAKE SURE YOU:   Understand these instructions.  Will watch  your condition.  Will get help right away if you are not doing well or get worse. Document Released: 05/20/2005 Document Revised: 08/12/2011 Document Reviewed: 03/06/2011 [facility] Patient Information 2014 [place].

## 2013-07-20 NOTE — ED Notes (Signed)
Gave pt urinal.   Pt standing up beside bed.

## 2013-07-20 NOTE — ED Provider Notes (Signed)
CSN: [id number]     Arrival date & time 07/20/13  0945 History   First MD Initiated Contact with Patient 07/20/13 1022     Chief Complaint  Patient presents with  . Emesis     (Consider location/radiation/quality/duration/timing/severity/associated sxs/prior Treatment) HPI Comments: Patient is a 73 year old male with history of hypertension and diabetes. He presents with vomiting and nausea for the past several days. He denies any diarrhea or abdominal pain. He was seen by his primary Dr. and had lab work yesterday when she was told was unremarkable. He continues to feel badly and vomits. All emesis and stool are nonbloody.  Patient is a 73 y.o. male presenting with vomiting. The history is provided by the patient.  Emesis Severity:  Moderate Duration:  3 days Timing:  Constant Progression:  Worsening Chronicity:  New Recent urination:  Decreased Relieved by:  Nothing Worsened by:  Nothing tried Ineffective treatments:  None tried Associated symptoms: no abdominal pain, no chills, no diarrhea and no fever     Past Medical History  Diagnosis Date  . Prostate cancer SEP 2011 BONE METS  . CVA (cerebral vascular accident)     off coumadin  . DM (diabetes mellitus)   . HTN (hypertension)   . Diverticulosis 10/09    LGIB  . Schatzki's ring OCT 2009  . Colon polyp OCT 2009  . Mallory - Weiss tear OCT 2009    AFTER VOMITING FROM GO-LYTELY  . Internal hemorrhoid    Past Surgical History  Procedure Laterality Date  . Hemicolectomy  LEFT ?complicated diverticulitis  . Colonoscopy  2009 2O TO LGIB    NL TI, AC POLYP not removed, TICS  . Upper gastrointestinal endoscopy  2009 2o to hematemesis    limited 2o to LARGE MW TEAR, S/P CLIPS x5   Family History  Problem Relation Age of Onset  . Colon cancer Neg Hx   . Colon polyps Neg Hx   . Heart disease Mother    History  Substance Use Topics  . Smoking status: Former Smoker -- 0.03 packs/day for 3 years    Types: Cigarettes   . Smokeless tobacco: Former Systems developer     Comment: three cigarettas a day  . Alcohol Use: No    Review of Systems  Constitutional: Negative for chills.  Gastrointestinal: Positive for vomiting. Negative for abdominal pain and diarrhea.  All other systems reviewed and are negative.      Allergies  Review of patient's allergies indicates no known allergies.  Home Medications   Current Outpatient Rx  Name  Route  Sig  Dispense  Refill  . albuterol (VENTOLIN HFA) 108 (90 BASE) MCG/ACT inhaler   Inhalation   Inhale 2 puffs into the lungs every 4 (four) hours as needed for wheezing or shortness of breath.   1 Inhaler   3   . amLODipine (NORVASC) 5 MG tablet   Oral   Take 1 tablet (5 mg total) by mouth daily.   30 tablet   6   . clopidogrel (PLAVIX) 75 MG tablet   Oral   Take 75 mg by mouth daily.           . enzalutamide (XTANDI) 40 MG capsule   Oral   Take 80 mg by mouth 2 (two) times daily.         . fish oil-omega-3 fatty acids 1000 MG capsule   Oral   Take 2 g by mouth daily.           Marland Kitchen  glipiZIDE (GLUCOTROL) 5 MG tablet      TAKE (1) TABLET BY MOUTH ONCE DAILY.   30 tablet   1     Patient needs to be seen before any further refill ...   . lisinopril (PRINIVIL,ZESTRIL) 2.5 MG tablet      TAKE 1 TABLET ONCE DAILY.   30 tablet   5   . megestrol (MEGACE) 40 MG tablet   Oral   Take 1 tablet (40 mg total) by mouth 2 (two) times daily.   60 tablet   11   . metoCLOPramide (REGLAN) 10 MG tablet   Oral   Take 1 tablet (10 mg total) by mouth 4 (four) times daily -  before meals and at bedtime.   30 tablet   0     Patient requests med be delivered   . ondansetron (ZOFRAN ODT) 4 MG disintegrating tablet   Oral   Take 1 tablet (4 mg total) by mouth every 8 (eight) hours as needed for nausea or vomiting.   30 tablet   2   . pantoprazole (PROTONIX) 40 MG tablet      TAKE 1 TABLET DAILY 30 MINUTES PRIOR TO BREAKFAST--DO NOT TAKE WITH PRILOSEC.   30  tablet   5   . simvastatin (ZOCOR) 10 MG tablet      TAKE ONE TABLET BY MOUTH AT BEDTIME.   30 tablet   1   . EXPIRED: fluticasone (FLONASE) 50 MCG/ACT nasal spray   Nasal   Place 2 sprays into the nose daily.   16 g   5    BP 116/60  Pulse 86  Temp(Src) 97.8 F (36.6 C) (Oral)  Resp 18  Ht 5\' 10"  (1.778 m)  Wt 218 lb (98.884 kg)  BMI 31.28 kg/m2  SpO2 97% Physical Exam  Nursing note and vitals reviewed. Constitutional: He is oriented to person, place, and time. He appears well-developed and well-nourished. No distress.  HENT:  Head: Normocephalic and atraumatic.  Mouth/Throat: Oropharynx is clear and moist.  Neck: Normal range of motion. Neck supple.  Cardiovascular: Normal rate, regular rhythm and normal heart sounds.   No murmur heard. Pulmonary/Chest: Effort normal and breath sounds normal. No respiratory distress. He has no wheezes.  Abdominal: Soft. Bowel sounds are normal. He exhibits no distension. There is no tenderness.  Musculoskeletal: Normal range of motion. He exhibits no edema.  Lymphadenopathy:    He has no cervical adenopathy.  Neurological: He is alert and oriented to person, place, and time.  Skin: Skin is warm and dry. He is not diaphoretic.    ED Course  Procedures (including critical care time) Labs Review Labs Reviewed  BASIC METABOLIC PANEL   Imaging Review Dg Chest 2 View  07/19/2013   CLINICAL DATA:  Cough.  EXAM: CHEST  2 VIEW  COMPARISON:  DG CHEST 2 VIEW dated 09/24/2011; DG CHEST 2 VIEW dated 11/13/2009  FINDINGS: Mediastinum and hilar structures are normal. The lungs are clear of infiltrates. A small nodule projected over left lung base, most likely nipple shadow. Repeat PA chest x-ray with nipple markers suggested. Heart size normal. Degenerative changes thoracic spine.  IMPRESSION: 1. Nodular density projected over left lung base, most likely nipple shadow. Repeat chest x-ray with nipple markers suggested. 2. No acute cardiopulmonary  disease.   Electronically Signed   By: Cactus   On: 07/19/2013 14:11   Ct Abdomen Pelvis W Contrast  07/19/2013   CLINICAL DATA:  Unspecified abdominal pain. History  prostate cancer.  EXAM: CT ABDOMEN AND PELVIS WITH CONTRAST  TECHNIQUE: Multidetector CT imaging of the abdomen and pelvis was performed using the standard protocol following bolus administration of intravenous contrast.  CONTRAST:  16mL OMNIPAQUE IOHEXOL 300 MG/ML  SOLN  COMPARISON:  Pelvis CT 02/28/2010  FINDINGS: Imaging of the lower abdomen limited by patient motion.  BODY WALL: Attempted fatty inguinal hernias bilaterally.  LOWER CHEST: Unremarkable.  ABDOMEN/PELVIS:  Liver: No focal abnormality.  Biliary: No evidence of biliary obstruction or stone.  Pancreas: Unremarkable.  Spleen: Unremarkable.  Adrenals: Unremarkable.  Kidneys and ureters: No hydronephrosis or stone.  Bladder: Unremarkable.  Reproductive: Fiducial markers within the prostate gland.  Bowel: No obstruction. Normal appendix.  Retroperitoneum: No mass or adenopathy. There is surgical clips near the GE junction and within the greater omentum.  Peritoneum: No free fluid or gas.  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  OSSEOUS: There are multiple sclerotic or lucent foci within the imaged skeleton, including the lateral right eighth rib, lateral left seventh rib, right L2 articular process, S1 body, bilateral inferior L4 body, central L5 body, and bilateral bony pelvis. A large sclerotic lesion in the posterior left ilium is new from previous. There is a 5 cm diameter lytic and sclerotic lesion in the central right ilium, with aggressive periosteal reaction.  IMPRESSION: 1. No acute intra-abdominal findings. 2. Osseous metastatic disease, progressed since comparison imaging in 2011. The largest deposit is an aggressive lesion in the right ilium, at least 4 cm in diameter.   Electronically Signed    By: Jorje Guild M.D.   On: 07/19/2013 15:41      MDM   Final diagnoses:  None    Patient presents here with complaints of nausea and vomiting. He denies any abdominal pain or diarrhea. He had laboratory studies performed yesterday that he states were unremarkable. He returns with continued vomiting this morning. Physical examination is essentially unremarkable and there is no evidence for dehydration. Repeat metabolic profile reveals acute abnormalities. He was given IV fluids and Zofran and is feeling better. At this point I feel as though he is stable for discharge. His symptoms are most likely viral in nature.   Veryl Speak, MD 07/20/13 [phone]

## 2013-07-20 NOTE — ED Notes (Signed)
Patient brought in via EMS. Alert and oriented. Airway patent. Patient c/o nausea and vomiting x2 weeks. Denies any pain. Patient seen here yesterday and discharge with antinausea medication but states he vomited medication back up this morning.

## 2013-07-26 ENCOUNTER — Other Ambulatory Visit: Payer: Self-pay | Admitting: Family Medicine

## 2013-07-26 NOTE — Telephone Encounter (Signed)
Medication refilled per protocol. 

## 2013-07-30 ENCOUNTER — Telehealth: Payer: Self-pay | Admitting: *Deleted

## 2013-07-30 NOTE — Telephone Encounter (Signed)
Received call from patient wife, Katy Apo. Reported that pt is now out of Promethazine that was given by ED. Stated that pharmacy requires refill before medication can be sent out. Also stated that pt has Zofran that MD called in. Advised to give Zofran for N/V.

## 2013-08-13 ENCOUNTER — Telehealth: Payer: Self-pay | Admitting: Family Medicine

## 2013-08-13 MED ORDER — AMLODIPINE BESYLATE 5 MG PO TABS: 5.0000 mg | ORAL_TABLET | Freq: Every day | ORAL | Status: AC

## 2013-08-13 NOTE — Telephone Encounter (Signed)
Refill appropriate and filled per protocol.  Patient made aware per VM.

## 2013-08-13 NOTE — Telephone Encounter (Signed)
Call back number is Geraldine ( pt wife states it needs to be delivered)  Pt is needing a refill on his amLODipine (NORVASC) 5 MG tablet

## 2013-08-15 ENCOUNTER — Emergency Department (HOSPITAL_COMMUNITY): Payer: Medicare Other

## 2013-08-15 ENCOUNTER — Encounter (HOSPITAL_COMMUNITY): Payer: Self-pay | Admitting: Emergency Medicine

## 2013-08-15 ENCOUNTER — Emergency Department (HOSPITAL_COMMUNITY)
Admission: EM | Admit: 2013-08-15 | Discharge: 2013-08-15 | Disposition: A | Discharge status: Home / Self Care | Payer: Medicare Other | Attending: Emergency Medicine | Admitting: Emergency Medicine

## 2013-08-15 DIAGNOSIS — Z87891 Personal history of nicotine dependence: Secondary | ICD-10-CM | POA: Insufficient documentation

## 2013-08-15 DIAGNOSIS — Z8673 Personal history of transient ischemic attack (TIA), and cerebral infarction without residual deficits: Secondary | ICD-10-CM | POA: Insufficient documentation

## 2013-08-15 DIAGNOSIS — Z860100 Personal history of colon polyps, unspecified: Secondary | ICD-10-CM | POA: Insufficient documentation

## 2013-08-15 DIAGNOSIS — Z8546 Personal history of malignant neoplasm of prostate: Secondary | ICD-10-CM | POA: Insufficient documentation

## 2013-08-15 DIAGNOSIS — Z79899 Other long term (current) drug therapy: Secondary | ICD-10-CM | POA: Insufficient documentation

## 2013-08-15 DIAGNOSIS — R1110 Vomiting, unspecified: Secondary | ICD-10-CM

## 2013-08-15 DIAGNOSIS — R112 Nausea with vomiting, unspecified: Secondary | ICD-10-CM | POA: Insufficient documentation

## 2013-08-15 DIAGNOSIS — Z8719 Personal history of other diseases of the digestive system: Secondary | ICD-10-CM | POA: Insufficient documentation

## 2013-08-15 DIAGNOSIS — Z7902 Long term (current) use of antithrombotics/antiplatelets: Secondary | ICD-10-CM | POA: Insufficient documentation

## 2013-08-15 DIAGNOSIS — I10 Essential (primary) hypertension: Secondary | ICD-10-CM | POA: Insufficient documentation

## 2013-08-15 DIAGNOSIS — E119 Type 2 diabetes mellitus without complications: Secondary | ICD-10-CM | POA: Insufficient documentation

## 2013-08-15 LAB — COMPREHENSIVE METABOLIC PANEL
ALT: 9 U/L (ref 0–53)
AST: 18 U/L (ref 0–37)
Albumin: 3.8 g/dL (ref 3.5–5.2)
Alkaline Phosphatase: 111 U/L (ref 39–117)
BUN: 12 mg/dL (ref 6–23)
CALCIUM: 10.3 mg/dL (ref 8.4–10.5)
CO2: 20 meq/L (ref 19–32)
CREATININE: 1.07 mg/dL (ref 0.50–1.35)
Chloride: 98 mEq/L (ref 96–112)
GFR, EST AFRICAN AMERICAN: 78 mL/min — AB (ref >90)
GFR, EST NON AFRICAN AMERICAN: 67 mL/min — AB (ref >90)
Glucose, Bld: 138 mg/dL — ABNORMAL HIGH (ref 70–99)
Potassium: 3.9 mEq/L (ref 3.7–5.3)
Sodium: 136 mEq/L — ABNORMAL LOW (ref 137–147)
Total Bilirubin: 0.5 mg/dL (ref 0.3–1.2)
Total Protein: 8.2 g/dL (ref 6.0–8.3)

## 2013-08-15 LAB — CBC WITH DIFFERENTIAL/PLATELET
Basophils Absolute: 0 10*3/uL (ref 0.0–0.1)
Basophils Relative: 0 % (ref 0–1)
EOS PCT: 3 % (ref 0–5)
Eosinophils Absolute: 0.1 10*3/uL (ref 0.0–0.7)
HEMATOCRIT: 39.8 % (ref 39.0–52.0)
Hemoglobin: 13.8 g/dL (ref 13.0–17.0)
LYMPHS PCT: 36 % (ref 12–46)
Lymphs Abs: 2 10*3/uL (ref 0.7–4.0)
MCH: 29.9 pg (ref 26.0–34.0)
MCHC: 34.7 g/dL (ref 30.0–36.0)
MCV: 86.1 fL (ref 78.0–100.0)
MONO ABS: 0.4 10*3/uL (ref 0.1–1.0)
MONOS PCT: 8 % (ref 3–12)
Neutro Abs: 2.9 10*3/uL (ref 1.7–7.7)
Neutrophils Relative %: 53 % (ref 43–77)
Platelets: 220 10*3/uL (ref 150–400)
RBC: 4.62 MIL/uL (ref 4.22–5.81)
RDW: 13.5 % (ref 11.5–15.5)
WBC: 5.4 10*3/uL (ref 4.0–10.5)

## 2013-08-15 LAB — LIPASE, BLOOD: LIPASE: 59 U/L (ref 11–59)

## 2013-08-15 MED ORDER — ONDANSETRON 4 MG PO TBDP: 4.0000 mg | ORAL_TABLET | Freq: Three times a day (TID) | ORAL | Status: DC | PRN

## 2013-08-15 MED ORDER — SODIUM CHLORIDE 0.9 % IV BOLUS (SEPSIS)
1000.0000 mL | Freq: Once | INTRAVENOUS | Status: AC
  Administered 2013-08-15: 1000 mL via INTRAVENOUS

## 2013-08-15 MED ORDER — ONDANSETRON HCL 4 MG/2ML IJ SOLN
INTRAMUSCULAR | Status: AC
  Administered 2013-08-15: 4 mg via INTRAVENOUS
  Filled 2013-08-15: qty 2

## 2013-08-15 MED ORDER — ONDANSETRON HCL 4 MG/2ML IJ SOLN: 4.0000 mg | Freq: Once | INTRAMUSCULAR | Status: DC

## 2013-08-15 MED ORDER — PANTOPRAZOLE SODIUM 40 MG IV SOLR
40.0000 mg | Freq: Once | INTRAVENOUS | Status: AC
  Administered 2013-08-15: 40 mg via INTRAVENOUS
  Filled 2013-08-15: qty 40

## 2013-08-15 MED ORDER — ONDANSETRON HCL 4 MG/2ML IJ SOLN
4.0000 mg | Freq: Once | INTRAMUSCULAR | Status: AC
  Administered 2013-08-15: 4 mg via INTRAVENOUS
  Filled 2013-08-15: qty 2

## 2013-08-15 MED ORDER — IOHEXOL 300 MG/ML  SOLN
100.0000 mL | Freq: Once | INTRAMUSCULAR | Status: AC | PRN
  Administered 2013-08-15: 100 mL via INTRAVENOUS

## 2013-08-15 MED ORDER — PROMETHAZINE HCL 25 MG PO TABS: 25.0000 mg | ORAL_TABLET | Freq: Four times a day (QID) | ORAL | Status: DC | PRN

## 2013-08-15 MED ORDER — ONDANSETRON HCL 4 MG/2ML IJ SOLN
4.0000 mg | Freq: Once | INTRAMUSCULAR | Status: AC
  Administered 2013-08-15: 4 mg via INTRAVENOUS

## 2013-08-15 MED ORDER — PROMETHAZINE HCL 25 MG/ML IJ SOLN
12.5000 mg | Freq: Once | INTRAMUSCULAR | Status: AC
  Administered 2013-08-15: 12.5 mg via INTRAVENOUS
  Filled 2013-08-15: qty 1

## 2013-08-15 NOTE — ED Notes (Signed)
Pt c/o n/v x 3-4 days and feeling fatigued. denies vomiting. lnbm x 2 days ago. Denies pain.  Pt currently being treated with po chemo for prostate ca.

## 2013-08-15 NOTE — ED Notes (Signed)
Dr Jeneen Rinks at bedside speaking with pt and spouse, pt drinking diet ginger ale at present,

## 2013-08-15 NOTE — ED Notes (Signed)
No n/v noted, pt tolerated diet ginger ale, comfort measures provided,

## 2013-08-15 NOTE — ED Notes (Addendum)
Pt presents to er for further evaluation of n/v. Pt states that he has been having n/v for the past three weeks, started having generalized weakness, dizziness this am, is currently being tx for prostate cancer ? Bone cancer, pt dry heaves upon arrival to er, denies any abd pain, diarrhea, chills, states that he was told that n/v were side effects of the cancer medication that pt is taking, has tried zofran tablets at home with no improvement,

## 2013-08-15 NOTE — ED Provider Notes (Addendum)
CSN: [id number]     Arrival date & time 08/15/13  6761 History   This chart was scribed for Walter Furry, MD by Era Bumpers, ED scribe. This patient was seen in room APA07/APA07 and the patient's care was started at 0851.  Chief Complaint  Patient presents with  . Emesis   The history is provided by the patient. No language interpreter was used.   HPI Comments: [name] is a 73 y.o. male who presents to the Emergency Department w/hx of bone and prostate CA, currently tx PO for CA w/Xtandi medicine which he has been taking for 3 months. Today his is complaining of intermittent, gradually worsened nausea and emesis episodes ongoing over the past x3 weeks. He reports over the past 2 days this has seemed to worsen and unable to tolerate liquids or solids well, has been vomiting phlegm mostly, denies blood in vomitus or stool. He states is constipated and last normal BM was yesterday AM. He saw his  PCP for this problem and was told that his CA medicine would sometimes make him nauseated. He denies any associated abdominal pain or fever as well denies HA, sore throat, neck/chest pain, SOB, urinary sx or dark color urine, diarrhea, myalgias, skin rash.   [facility] Dr. Weston Anna PCP Dr. Buelah Manis  Past Medical History  Diagnosis Date  . Prostate cancer SEP 2011 BONE METS  . CVA (cerebral vascular accident)     off coumadin  . DM (diabetes mellitus)   . HTN (hypertension)   . Diverticulosis 10/09    LGIB  . Schatzki's ring OCT 2009  . Colon polyp OCT 2009  . Mallory - Weiss tear OCT 2009    AFTER VOMITING FROM GO-LYTELY  . Internal hemorrhoid    Past Surgical History  Procedure Laterality Date  . Hemicolectomy  LEFT ?complicated diverticulitis  . Colonoscopy  2009 2O TO LGIB    NL TI, AC POLYP not removed, TICS  . Upper gastrointestinal endoscopy  2009 2o to hematemesis    limited 2o to LARGE MW TEAR, S/P CLIPS x5   Family History  Problem Relation Age of Onset  . Colon cancer Neg Hx   .  Colon polyps Neg Hx   . Heart disease Mother    History  Substance Use Topics  . Smoking status: Former Smoker -- 0.03 packs/day for 3 years    Types: Cigarettes  . Smokeless tobacco: Former Systems developer     Comment: three cigarettas a day  . Alcohol Use: No    Review of Systems  Constitutional: Negative for fever, chills, diaphoresis, appetite change and fatigue.  HENT: Negative for mouth sores, sore throat and trouble swallowing.   Eyes: Negative for visual disturbance.  Respiratory: Negative for cough, chest tightness, shortness of breath and wheezing.   Cardiovascular: Negative for chest pain.  Gastrointestinal: Positive for nausea and vomiting. Negative for abdominal pain, diarrhea and abdominal distention.  Endocrine: Negative for polydipsia, polyphagia and polyuria.  Genitourinary: Negative for dysuria, frequency and hematuria.  Musculoskeletal: Negative for gait problem.  Skin: Negative for color change, pallor and rash.  Neurological: Negative for dizziness, syncope, light-headedness and headaches.  Hematological: Does not bruise/bleed easily.  Psychiatric/Behavioral: Negative for behavioral problems and confusion.  All other systems reviewed and are negative.   Allergies  Review of patient's allergies indicates no known allergies.  Home Medications   Current Outpatient Rx  Name  Route  Sig  Dispense  Refill  . albuterol (VENTOLIN HFA) 108 (90 BASE)  MCG/ACT inhaler   Inhalation   Inhale 2 puffs into the lungs every 4 (four) hours as needed for wheezing or shortness of breath.   1 Inhaler   3   . amLODipine (NORVASC) 5 MG tablet   Oral   Take 1 tablet (5 mg total) by mouth daily.   30 tablet   6   . clopidogrel (PLAVIX) 75 MG tablet   Oral   Take 75 mg by mouth daily.           . enzalutamide (XTANDI) 40 MG capsule   Oral   Take 80 mg by mouth 2 (two) times daily.         Marland Kitchen glipiZIDE (GLUCOTROL) 5 MG tablet      TAKE (1) TABLET BY MOUTH ONCE DAILY.   30  tablet   2   . lisinopril (PRINIVIL,ZESTRIL) 2.5 MG tablet      TAKE (1) TABLET BY MOUTH ONCE DAILY.   30 tablet   2   . megestrol (MEGACE) 40 MG tablet   Oral   Take 1 tablet (40 mg total) by mouth 2 (two) times daily.   60 tablet   11   . ondansetron (ZOFRAN ODT) 4 MG disintegrating tablet   Oral   Take 1 tablet (4 mg total) by mouth every 8 (eight) hours as needed for nausea or vomiting.   30 tablet   2   . pantoprazole (PROTONIX) 40 MG tablet      TAKE 1 TABLET DAILY 30 MINUTES PRIOR TO BREAKFAST--DO NOT TAKE WITH PRILOSEC.   30 tablet   5   . simvastatin (ZOCOR) 10 MG tablet      TAKE ONE TABLET BY MOUTH AT BEDTIME.   30 tablet   2   . ondansetron (ZOFRAN ODT) 4 MG disintegrating tablet   Oral   Take 1 tablet (4 mg total) by mouth every 8 (eight) hours as needed for nausea.   30 tablet   0   . ondansetron (ZOFRAN ODT) 4 MG disintegrating tablet   Oral   Take 1 tablet (4 mg total) by mouth every 8 (eight) hours as needed for nausea.   6 tablet   0   . ondansetron (ZOFRAN ODT) 4 MG disintegrating tablet   Oral   Take 1 tablet (4 mg total) by mouth every 8 (eight) hours as needed for nausea.   20 tablet   1   . promethazine (PHENERGAN) 25 MG tablet   Oral   Take 1 tablet (25 mg total) by mouth every 6 (six) hours as needed for nausea or vomiting.   30 tablet   0    Triage Vitals: BP 165/90  Pulse 94  Temp(Src) 97.9 F (36.6 C) (Oral)  Resp 18  Ht 5\' 6"  (1.676 m)  Wt 218 lb (98.884 kg)  BMI 35.20 kg/m2  SpO2 99%  Physical Exam  Nursing note and vitals reviewed. Constitutional: He is oriented to person, place, and time. He appears well-developed and well-nourished. No distress.  HENT:  Head: Normocephalic.  Dry mucous membranes  Eyes: Conjunctivae are normal. Pupils are equal, round, and reactive to light. No scleral icterus.  Neck: Normal range of motion. Neck supple. No thyromegaly present.  Cardiovascular: Normal rate, regular rhythm and  normal heart sounds.  Exam reveals no gallop and no friction rub.   No murmur heard. Pulmonary/Chest: Effort normal and breath sounds normal. No respiratory distress. He has no wheezes. He has no rales.  Abdominal:  Soft. Bowel sounds are normal. He exhibits no distension. There is no tenderness. There is no rebound.  Low midline well healed abdominal incision   Musculoskeletal: Normal range of motion. He exhibits no edema.  No peripheral edema  Neurological: He is alert and oriented to person, place, and time.  Normal strength in his arms and legs  Skin: Skin is warm and dry. No rash noted.  Psychiatric: He has a normal mood and affect. His behavior is normal.    ED Course  Procedures (including critical care time) DIAGNOSTIC STUDIES: Oxygen Saturation is 99% on room air, normal by my interpretation.    COORDINATION OF CARE: At 915 AM Discussed treatment plan with patient which includes nausea medicine. Patient agrees.   Labs Review Labs Reviewed  COMPREHENSIVE METABOLIC PANEL - Abnormal; Notable for the following:    Sodium 136 (*)    Glucose, Bld 138 (*)    GFR calc non Af Amer 67 (*)    GFR calc Af Amer 78 (*)    All other components within normal limits  CBC WITH DIFFERENTIAL  LIPASE, BLOOD   Imaging Review No results found.   EKG Interpretation None      MDM   Final diagnoses:  Emesis    11:00:  Pt sleeping.  After awakening, pt initially felt less nauseated.  Given po liquids.  After only a few sips, pt with additional nausea and vomiting.  Additional IVF c phenergan ordered.  I personally performed the services described in this documentation, which was scribed in my presence. The recorded information has been reviewed and is accurate.      Walter Furry, MD 08/15/13 [place], MD 08/18/13 [phone]

## 2013-08-15 NOTE — ED Notes (Signed)
Pt drowsy, will arouse when spoken to, wife at bedside, pt and family updated on plan of care,

## 2013-08-15 NOTE — Discharge Instructions (Signed)
Do not take your [place] today, or tomorrow. Zofran Oral Dissolving Tablets for nausea, and before taking Xtandi when you re-start the medication. Liquids only today.  Advance diet tomorrow. Call your Doctor tomorrow for instructions on re-starting Xtandi. Continue your other medications as prescribed.  Nausea and Vomiting Nausea means you feel sick to your stomach. Throwing up (vomiting) is a reflex where stomach contents come out of your mouth. HOME CARE   Take medicine as told by your doctor.  Do not force yourself to eat. However, you do need to drink fluids.  If you feel like eating, eat a normal diet as told by your doctor.  Eat rice, wheat, potatoes, bread, lean meats, yogurt, fruits, and vegetables.  Avoid high-fat foods.  Drink enough fluids to keep your pee (urine) clear or pale yellow.  Ask your doctor how to replace body fluid losses (rehydrate). Signs of body fluid loss (dehydration) include:  Feeling very thirsty.  Dry lips and mouth.  Feeling dizzy.  Dark pee.  Peeing less than normal.  Feeling confused.  Fast breathing or heart rate. GET HELP RIGHT AWAY IF:   You have blood in your throw up.  You have black or bloody poop (stool).  You have a bad headache or stiff neck.  You feel confused.  You have bad belly (abdominal) pain.  You have chest pain or trouble breathing.  You do not pee at least once every 8 hours.  You have cold, clammy skin.  You keep throwing up after 24 to 48 hours.  You have a fever. MAKE SURE YOU:   Understand these instructions.  Will watch your condition.  Will get help right away if you are not doing well or get worse. Document Released: 11/06/2007 Document Revised: 08/12/2011 Document Reviewed: 10/19/2010 [facility] Patient Information 2014 [place], [place].

## 2013-08-15 NOTE — ED Notes (Signed)
Pt given diet ginger ale, is hesitant to attempt to drink anything, states "I will just throw it back up" comfort measures provided, advised pt to take small sips at a time,

## 2013-08-15 NOTE — ED Notes (Signed)
Pt took a few sips of diet ginger ale, started experiencing n/v, had approximate 50 cc clear emesis noted, Dr Jeneen Rinks notified,

## 2013-08-17 ENCOUNTER — Telehealth: Payer: Self-pay | Admitting: Oncology

## 2013-08-17 NOTE — Telephone Encounter (Signed)
S/W PATIENT AND GAVE NEW PATIENT APPT FOR 03/24 @ 10:30 W/DR. SHADAD.  REFERRING DR. Ridgemark PACKET MAILED.

## 2013-08-18 ENCOUNTER — Telehealth: Payer: Self-pay | Admitting: *Deleted

## 2013-08-18 ENCOUNTER — Telehealth: Payer: Self-pay | Admitting: Oncology

## 2013-08-18 NOTE — Telephone Encounter (Signed)
Received call from [facility], nurse with Dr. Carlena Bjornstad, urologist.   Reported that patient had begun Xtandi 40mg , but patient noted with increased swelling.   Advised that medication was stopped.   Also advised that Provenge dosing could be taken over by either PCP or oncologist.   MD to be made aware.

## 2013-08-18 NOTE — Telephone Encounter (Signed)
C/D 08/18/13 for appt. 08/24/13

## 2013-08-19 ENCOUNTER — Telehealth: Payer: Self-pay | Admitting: Medical Oncology

## 2013-08-19 NOTE — Telephone Encounter (Signed)
Call from [facility] at  Dr. Lily Kocher office informing us that patient stopped taking Xtandi after being  in ED for emesis and advised to stop.   Call to patient, [facility], for more information and f/u. Asked patient to call office back at earliest convenience.

## 2013-08-20 ENCOUNTER — Encounter (HOSPITAL_COMMUNITY): Payer: Self-pay | Admitting: Emergency Medicine

## 2013-08-20 ENCOUNTER — Other Ambulatory Visit: Payer: Self-pay | Admitting: Oncology

## 2013-08-20 ENCOUNTER — Emergency Department (HOSPITAL_COMMUNITY)
Admission: EM | Admit: 2013-08-20 | Discharge: 2013-08-21 | Disposition: A | Discharge status: Home / Self Care | Payer: Medicare Other | Attending: Emergency Medicine | Admitting: Emergency Medicine

## 2013-08-20 DIAGNOSIS — Q393 Congenital stenosis and stricture of esophagus: Secondary | ICD-10-CM

## 2013-08-20 DIAGNOSIS — Z8673 Personal history of transient ischemic attack (TIA), and cerebral infarction without residual deficits: Secondary | ICD-10-CM | POA: Insufficient documentation

## 2013-08-20 DIAGNOSIS — Z7902 Long term (current) use of antithrombotics/antiplatelets: Secondary | ICD-10-CM | POA: Insufficient documentation

## 2013-08-20 DIAGNOSIS — R1110 Vomiting, unspecified: Secondary | ICD-10-CM

## 2013-08-20 DIAGNOSIS — Z860100 Personal history of colon polyps, unspecified: Secondary | ICD-10-CM | POA: Insufficient documentation

## 2013-08-20 DIAGNOSIS — Z8719 Personal history of other diseases of the digestive system: Secondary | ICD-10-CM | POA: Insufficient documentation

## 2013-08-20 DIAGNOSIS — Z8546 Personal history of malignant neoplasm of prostate: Secondary | ICD-10-CM | POA: Insufficient documentation

## 2013-08-20 DIAGNOSIS — Z79899 Other long term (current) drug therapy: Secondary | ICD-10-CM | POA: Insufficient documentation

## 2013-08-20 DIAGNOSIS — Z8679 Personal history of other diseases of the circulatory system: Secondary | ICD-10-CM | POA: Insufficient documentation

## 2013-08-20 DIAGNOSIS — E860 Dehydration: Secondary | ICD-10-CM

## 2013-08-20 DIAGNOSIS — Z87891 Personal history of nicotine dependence: Secondary | ICD-10-CM | POA: Insufficient documentation

## 2013-08-20 DIAGNOSIS — Q391 Atresia of esophagus with tracheo-esophageal fistula: Secondary | ICD-10-CM | POA: Insufficient documentation

## 2013-08-20 DIAGNOSIS — E119 Type 2 diabetes mellitus without complications: Secondary | ICD-10-CM | POA: Insufficient documentation

## 2013-08-20 DIAGNOSIS — I10 Essential (primary) hypertension: Secondary | ICD-10-CM | POA: Insufficient documentation

## 2013-08-20 DIAGNOSIS — C61 Malignant neoplasm of prostate: Secondary | ICD-10-CM

## 2013-08-20 LAB — CBC WITH DIFFERENTIAL/PLATELET
BASOS ABS: 0 10*3/uL (ref 0.0–0.1)
Basophils Relative: 0 % (ref 0–1)
Eosinophils Absolute: 0.2 10*3/uL (ref 0.0–0.7)
Eosinophils Relative: 4 % (ref 0–5)
HCT: 38.6 % — ABNORMAL LOW (ref 39.0–52.0)
Hemoglobin: 13.8 g/dL (ref 13.0–17.0)
LYMPHS ABS: 1.8 10*3/uL (ref 0.7–4.0)
Lymphocytes Relative: 33 % (ref 12–46)
MCH: 30.3 pg (ref 26.0–34.0)
MCHC: 35.8 g/dL (ref 30.0–36.0)
MCV: 84.6 fL (ref 78.0–100.0)
Monocytes Absolute: 0.4 10*3/uL (ref 0.1–1.0)
Monocytes Relative: 8 % (ref 3–12)
NEUTROS ABS: 3.1 10*3/uL (ref 1.7–7.7)
NEUTROS PCT: 56 % (ref 43–77)
PLATELETS: 181 10*3/uL (ref 150–400)
RBC: 4.56 MIL/uL (ref 4.22–5.81)
RDW: 13.6 % (ref 11.5–15.5)
WBC: 5.5 10*3/uL (ref 4.0–10.5)

## 2013-08-20 LAB — BASIC METABOLIC PANEL
BUN: 9 mg/dL (ref 6–23)
CO2: 21 meq/L (ref 19–32)
Calcium: 9.9 mg/dL (ref 8.4–10.5)
Chloride: 98 mEq/L (ref 96–112)
Creatinine, Ser: 1.12 mg/dL (ref 0.50–1.35)
GFR calc Af Amer: 74 mL/min — ABNORMAL LOW (ref >90)
GFR calc non Af Amer: 64 mL/min — ABNORMAL LOW (ref >90)
GLUCOSE: 125 mg/dL — AB (ref 70–99)
POTASSIUM: 3.5 meq/L — AB (ref 3.7–5.3)
SODIUM: 136 meq/L — AB (ref 137–147)

## 2013-08-20 MED ORDER — ONDANSETRON HCL 4 MG/2ML IJ SOLN
4.0000 mg | Freq: Once | INTRAMUSCULAR | Status: AC
  Administered 2013-08-20: 4 mg via INTRAVENOUS
  Filled 2013-08-20: qty 2

## 2013-08-20 MED ORDER — SODIUM CHLORIDE 0.9 % IV BOLUS (SEPSIS)
1000.0000 mL | Freq: Once | INTRAVENOUS | Status: AC
  Administered 2013-08-20: 1000 mL via INTRAVENOUS

## 2013-08-20 NOTE — ED Notes (Signed)
Wife states pt has been seen several times for n/v.  Pt was seen here on March 15th for n/v and it stopped until the 17th. Pt has had continuous n/v since Tuesday. Pt is a cancer pt.

## 2013-08-20 NOTE — ED Provider Notes (Signed)
CSN: [id number]     Arrival date & time 08/20/13  2242 History  This chart was scribed for Sharyon Cable, MD by Elby Beck, ED Scribe. This patient was seen in room APA19/[facility]19 and the patient's care was started at 11:19 PM.   Chief Complaint  Patient presents with  . Emesis    Patient is a 73 y.o. male presenting with vomiting.  Emesis Severity:  Moderate Duration:  4 weeks Timing:  Intermittent Emesis appearance: described as "clear and light mucous"; non-bloody; no black coloration. Progression:  Unchanged Chronicity:  New Context: not post-tussive and not self-induced   Relieved by:  Nothing Worsened by:  Nothing tried Ineffective treatments:  Antiemetics (Zofran) Associated symptoms: no abdominal pain, no cough, no diarrhea and no fever     HPI Comments: [name] is a 73 y.o. male who presents to the Emergency Department complaining of persistent, intermittent episodes of emesis over the past 4 weeks. He states that over this time period, he has had days with no vomiting and other days with multiple episodes of vomiting. He denies noticing any blood or black coloration in his episodes of emesis. His wife reports that the emesis has resembled "clear and light mucous". He states that he has been able to eat at times but at others he has not. He reports that he has been taking Zofran without relief. Pt's significant other adds that pt has a history of prostate CA, which is believed to have "spread to his bones". Pt states that he is still making urine. Pt denies cough, fever, diarrhea, chest pain, SOB, abdominal pain, back pain, dysuria or any other symptoms.   Past Medical History  Diagnosis Date  . Prostate cancer SEP 2011 BONE METS  . CVA (cerebral vascular accident)     off coumadin  . DM (diabetes mellitus)   . HTN (hypertension)   . Diverticulosis 10/09    LGIB  . Schatzki's ring OCT 2009  . Colon polyp OCT 2009  . Mallory - Weiss tear OCT 2009    AFTER  VOMITING FROM GO-LYTELY  . Internal hemorrhoid    Past Surgical History  Procedure Laterality Date  . Hemicolectomy  LEFT ?complicated diverticulitis  . Colonoscopy  2009 2O TO LGIB    NL TI, AC POLYP not removed, TICS  . Upper gastrointestinal endoscopy  2009 2o to hematemesis    limited 2o to LARGE MW TEAR, S/P CLIPS x5   Family History  Problem Relation Age of Onset  . Colon cancer Neg Hx   . Colon polyps Neg Hx   . Heart disease Mother    History  Substance Use Topics  . Smoking status: Former Smoker -- 0.03 packs/day for 3 years    Types: Cigarettes  . Smokeless tobacco: Former Systems developer     Comment: three cigarettas a day  . Alcohol Use: No    Review of Systems  Constitutional: Negative for fever.  Respiratory: Negative for cough and shortness of breath.   Cardiovascular: Negative for chest pain.  Gastrointestinal: Positive for nausea and vomiting. Negative for abdominal pain and diarrhea.  Genitourinary: Negative for dysuria.  Musculoskeletal: Negative for back pain.  All other systems reviewed and are negative.   Allergies  Review of patient's allergies indicates no known allergies.  Home Medications   Current Outpatient Rx  Name  Route  Sig  Dispense  Refill  . amLODipine (NORVASC) 5 MG tablet   Oral   Take 1 tablet (5 mg  total) by mouth daily.   30 tablet   6   . clopidogrel (PLAVIX) 75 MG tablet   Oral   Take 75 mg by mouth daily.           . enzalutamide (XTANDI) 40 MG capsule   Oral   Take 80 mg by mouth 2 (two) times daily.         Marland Kitchen glipiZIDE (GLUCOTROL) 5 MG tablet   Oral   Take 5 mg by mouth daily.         Marland Kitchen lisinopril (PRINIVIL,ZESTRIL) 2.5 MG tablet   Oral   Take 2.5 mg by mouth daily.         . megestrol (MEGACE) 40 MG tablet   Oral   Take 1 tablet (40 mg total) by mouth 2 (two) times daily.   60 tablet   11   . pantoprazole (PROTONIX) 40 MG tablet   Oral   Take 40 mg by mouth daily before breakfast.         .  promethazine (PHENERGAN) 25 MG tablet   Oral   Take 1 tablet (25 mg total) by mouth every 6 (six) hours as needed for nausea or vomiting.   30 tablet   0   . simvastatin (ZOCOR) 10 MG tablet   Oral   Take 10 mg by mouth at bedtime.         Marland Kitchen albuterol (VENTOLIN HFA) 108 (90 BASE) MCG/ACT inhaler   Inhalation   Inhale 2 puffs into the lungs every 4 (four) hours as needed for wheezing or shortness of breath.   1 Inhaler   3   . promethazine (PHENERGAN) 25 MG suppository   Rectal   Place 1 suppository (25 mg total) rectally every 6 (six) hours as needed for nausea or vomiting.   12 each   0    Triage Vitals: BP 124/78  Pulse 104  Temp(Src) 97.4 F (36.3 C)  Resp 18  Ht 5\' 9"  (1.753 m)  Wt 218 lb (98.884 kg)  BMI 32.18 kg/m2  SpO2 99%  Physical Exam  Nursing note and vitals reviewed. CONSTITUTIONAL: Well developed/well nourished, no distress noted HEAD: Normocephalic/atraumatic EYES: EOMI/PERRL, no icterus ENMT: Mucous membranes dry NECK: supple no meningeal signs SPINE:entire spine nontender CV: S1/S2 noted, no murmurs/rubs/gallops noted LUNGS: Lungs are clear to auscultation bilaterally, no apparent distress ABDOMEN: soft, nontender, no rebound or guarding GU:no cva tenderness NEURO: Pt is awake/alert, moves all extremitiesx4 EXTREMITIES: pulses normal, full ROM SKIN: warm, color normal PSYCH: no abnormalities of mood noted  ED Course  Procedures  DIAGNOSTIC STUDIES: Oxygen Saturation is 99% on RA, normal by my interpretation.    COORDINATION OF CARE: 11:25 PM- Discussed plan to obtain diagnostic lab work. Will also order IV fluids and Zofran. Pt advised of plan for treatment and pt agrees.  1:42 AM Pt feels improved He is without pain He did have prolonged qt on EKG.  Review of meds do not reveal obvious cause.  Will have him stop zofran for now and continue either oral or rectal phenergan.   I offered admission to patient due to h/o persistent vomiting  and frequent ER visits, but he preferred to go home Apparently he has stopped the oral chemo drug he had been taking.    Medications  sodium chloride 0.9 % bolus 1,000 mL (0 mLs Intravenous Stopped 08/21/13 0027)  ondansetron (ZOFRAN) injection 4 mg (4 mg Intravenous Given 08/20/13 2339)  metoCLOPramide (REGLAN) injection 10 mg (10  mg Intravenous Given 08/21/13 0027)  sodium chloride 0.9 % bolus 1,000 mL (0 mLs Intravenous Stopped 08/21/13 0132)   Labs Review Labs Reviewed  CBC WITH DIFFERENTIAL - Abnormal; Notable for the following:    HCT 38.6 (*)    All other components within normal limits  BASIC METABOLIC PANEL - Abnormal; Notable for the following:    Sodium 136 (*)    Potassium 3.5 (*)    Glucose, Bld 125 (*)    GFR calc non Af Amer 64 (*)    GFR calc Af Amer 74 (*)    All other components within normal limits     EKG Interpretation   Date/Time:  Friday August 20 2013 23:41:56 EDT Ventricular Rate:  80 PR Interval:  158 QRS Duration: 104 QT Interval:  466 QTC Calculation: 537 R Axis:   -28 Text Interpretation:  Sinus rhythm with Premature atrial complexes with  Abberant conduction Prolonged QT Abnormal ECG When compared with ECG of  23-Mar-2008 00:11, Abberant conduction is now Present QT has lengthened  Confirmed by Christy Gentles  MD, Elenore Rota ([id number]) on 08/20/2013 11:48:52 PM      MDM   Final diagnoses:  Vomiting  Dehydration    Nursing notes including past medical history and social history reviewed and considered in documentation Labs/vital reviewed and considered Previous records reviewed and considered    I personally performed the services described in this documentation, which was scribed in my presence. The recorded information has been reviewed and is accurate.     Sharyon Cable, MD 08/21/13 [id number]

## 2013-08-21 MED ORDER — PROMETHAZINE HCL 25 MG RE SUPP: 25.0000 mg | Freq: Four times a day (QID) | RECTAL | Status: DC | PRN

## 2013-08-21 MED ORDER — METOCLOPRAMIDE HCL 5 MG/ML IJ SOLN
10.0000 mg | Freq: Once | INTRAMUSCULAR | Status: AC
  Administered 2013-08-21: 10 mg via INTRAVENOUS
  Filled 2013-08-21: qty 2

## 2013-08-21 MED ORDER — SODIUM CHLORIDE 0.9 % IV BOLUS (SEPSIS)
1000.0000 mL | Freq: Once | INTRAVENOUS | Status: AC
  Administered 2013-08-21: 1000 mL via INTRAVENOUS

## 2013-08-21 NOTE — Discharge Instructions (Signed)
Dehydration, Adult  Dehydration means your body does not have as much fluid as it needs. Your kidneys, brain, and heart will not work properly without the right amount of fluids and salt.   HOME CARE   Ask your doctor how to replace body fluid losses (rehydrate).   Drink enough fluids to keep your pee (urine) clear or pale yellow.   Drink small amounts of fluids often if you feel sick to your stomach (nauseous) or throw up (vomit).   Eat like you normally do.   Avoid:   Foods or drinks high in sugar.   Bubbly (carbonated) drinks.   Juice.   Very hot or cold fluids.   Drinks with caffeine.   Fatty, greasy foods.   Alcohol.   Tobacco.   Eating too much.   Gelatin desserts.   Wash your hands to avoid spreading germs (bacteria, viruses).   Only take medicine as told by your doctor.   Keep all doctor visits as told.  GET HELP RIGHT AWAY IF:    You cannot drink something without throwing up.   You get worse even with treatment.   Your vomit has blood in it or looks greenish.   Your poop (stool) has blood in it or looks black and tarry.   You have not peed in 6 to 8 hours.   You pee a small amount of very dark pee.   You have a fever.   You pass out (faint).   You have belly (abdominal) pain that gets worse or stays in one spot (localizes).   You have a rash, stiff neck, or bad headache.   You get easily annoyed, sleepy, or are hard to wake up.   You feel weak, dizzy, or very thirsty.  MAKE SURE YOU:    Understand these instructions.   Will watch your condition.   Will get help right away if you are not doing well or get worse.  Document Released: 03/16/2009 Document Revised: 08/12/2011 Document Reviewed: 01/07/2011  Exit[employer] Patient Information 2014 [employer].

## 2013-08-23 ENCOUNTER — Telehealth: Payer: Self-pay | Admitting: Medical Oncology

## 2013-08-23 NOTE — Telephone Encounter (Signed)
Call to patient to confirm tomorrow's appt., spouse states he is asleep, but confirmed appt with her. Requested that pt bring in current medication list and informed her about our free [place] parking. Spouse gave verbal understanding, denies questions at this time.

## 2013-08-24 ENCOUNTER — Ambulatory Visit: Payer: Medicare Other

## 2013-08-24 ENCOUNTER — Ambulatory Visit: Payer: Medicare Other | Admitting: Oncology

## 2013-08-24 ENCOUNTER — Other Ambulatory Visit: Payer: Medicare Other

## 2013-08-25 ENCOUNTER — Other Ambulatory Visit (HOSPITAL_BASED_OUTPATIENT_CLINIC_OR_DEPARTMENT_OTHER): Payer: Medicare Other

## 2013-08-25 ENCOUNTER — Telehealth: Payer: Self-pay | Admitting: Oncology

## 2013-08-25 ENCOUNTER — Ambulatory Visit: Payer: Medicare Other

## 2013-08-25 ENCOUNTER — Encounter: Payer: Self-pay | Admitting: Oncology

## 2013-08-25 ENCOUNTER — Ambulatory Visit (HOSPITAL_BASED_OUTPATIENT_CLINIC_OR_DEPARTMENT_OTHER): Payer: Medicare Other | Admitting: Oncology

## 2013-08-25 VITALS — BP 141/88 | HR 98 | Temp 97.3°F | Resp 18 | Ht 69.0 in | Wt 204.6 lb

## 2013-08-25 DIAGNOSIS — C7951 Secondary malignant neoplasm of bone: Secondary | ICD-10-CM

## 2013-08-25 DIAGNOSIS — R112 Nausea with vomiting, unspecified: Secondary | ICD-10-CM

## 2013-08-25 DIAGNOSIS — E291 Testicular hypofunction: Secondary | ICD-10-CM

## 2013-08-25 DIAGNOSIS — C61 Malignant neoplasm of prostate: Secondary | ICD-10-CM

## 2013-08-25 DIAGNOSIS — C7952 Secondary malignant neoplasm of bone marrow: Secondary | ICD-10-CM

## 2013-08-25 DIAGNOSIS — R634 Abnormal weight loss: Secondary | ICD-10-CM

## 2013-08-25 LAB — COMPREHENSIVE METABOLIC PANEL (CC13)
ALBUMIN: 3.5 g/dL (ref 3.5–5.0)
ALT: 7 U/L (ref 0–55)
AST: 13 U/L (ref 5–34)
Alkaline Phosphatase: 107 U/L (ref 40–150)
Anion Gap: 13 mEq/L — ABNORMAL HIGH (ref 3–11)
BUN: 8.7 mg/dL (ref 7.0–26.0)
CALCIUM: 10 mg/dL (ref 8.4–10.4)
CHLORIDE: 107 meq/L (ref 98–109)
CO2: 19 meq/L — AB (ref 22–29)
Creatinine: 1.1 mg/dL (ref 0.7–1.3)
Glucose: 180 mg/dl — ABNORMAL HIGH (ref 70–140)
Potassium: 4 mEq/L (ref 3.5–5.1)
SODIUM: 139 meq/L (ref 136–145)
TOTAL PROTEIN: 7.2 g/dL (ref 6.4–8.3)
Total Bilirubin: 0.41 mg/dL (ref 0.20–1.20)

## 2013-08-25 LAB — CBC WITH DIFFERENTIAL/PLATELET
BASO%: 0.5 % (ref 0.0–2.0)
Basophils Absolute: 0 10*3/uL (ref 0.0–0.1)
EOS ABS: 0.3 10*3/uL (ref 0.0–0.5)
EOS%: 4.5 % (ref 0.0–7.0)
HCT: 38.3 % — ABNORMAL LOW (ref 38.4–49.9)
HGB: 12.7 g/dL — ABNORMAL LOW (ref 13.0–17.1)
LYMPH%: 36.7 % (ref 14.0–49.0)
MCH: 29.4 pg (ref 27.2–33.4)
MCHC: 33.1 g/dL (ref 32.0–36.0)
MCV: 88.9 fL (ref 79.3–98.0)
MONO#: 0.4 10*3/uL (ref 0.1–0.9)
MONO%: 7.4 % (ref 0.0–14.0)
NEUT%: 50.9 % (ref 39.0–75.0)
NEUTROS ABS: 2.9 10*3/uL (ref 1.5–6.5)
Platelets: 211 10*3/uL (ref 140–400)
RBC: 4.31 10*6/uL (ref 4.20–5.82)
RDW: 14.6 % (ref 11.0–14.6)
WBC: 5.6 10*3/uL (ref 4.0–10.3)
lymph#: 2.1 10*3/uL (ref 0.9–3.3)

## 2013-08-25 NOTE — Telephone Encounter (Signed)
gv and printed aptp sched and avs for pt for March scan @ [facility] and April appt.Marland KitchenMarland Kitchen

## 2013-08-25 NOTE — Consult Note (Signed)
Reason for Referral: Prostate cancer.   HPI: 73 year old gentleman currently of [place] where he lived the majority of his life. He is a gentleman with past medical history significant for hypertension and hyperlipidemia as well as borderline diabetes. He was diagnosed with prostate cancer in 2007 where he presented with a Gleason score 3+3 equals 6 and a PSA of 4.3. His initial clinical stage was T1 C. He was treated with external beam radiation utilizing IMRT that was completed in September of 2007. His PSA nadir appears to be around 1.9 in November of 2009. He developed recurrent disease in 2011 with a PSA up to 15.28 and subsequently was found to have bony metastasis including a sclerotic lesion in the chest area. He was treated with androgen deprivation in the form of Degarelix under the care of Dr. Karsten Ro and responded nicely to his bony metastasis including the resolution of a right first rib metastasis. He has been on androgen deprivation since. In July of 2014, he developed castration resistant disease with a PSA rise up to 13.89 despite castrate levels testosterone of 15. He was started on Xtandi in October of 2014 with a PSA of 22.89. PSA dropped down in January 2015 to 16.93. Most recently however, he started deteriorating with symptoms of abdominal pain, nausea, vomiting and poor by mouth intake and failure to thrive. He had a CT scan of the abdomen and pelvis in February of 2015 which showed slight progression of his osseous metastasis including an aggressive lesion in the right ilium measuring at least 4 cm. He had a repeat CT scan of the abdomen in March of 2015 due to persistent nausea and vomiting and weight loss. There is no evidence of any visceral metastasis detected based on that CT scan. Patient referred to me for evaluation for treatment options. Clinically, although he has done poorly for the last few weeks he is feeling better last few days. He has stopped [name]  which thinks it is helping at this time. His able to tolerate solid food better at this time without any vomiting in the last couple days. His energy still down but is improving. He is not reporting any abdominal pain or diarrhea. He does report constipation. He does not report any bony pain or any pelvic pain. Does not report any neurological symptoms and is able to ambulate without any difficulties.   Past Medical History  Diagnosis Date  . Prostate cancer SEP 2011 BONE METS  . CVA (cerebral vascular accident)     off coumadin  . DM (diabetes mellitus)   . HTN (hypertension)   . Diverticulosis 10/09    LGIB  . Schatzki's ring OCT 2009  . Colon polyp OCT 2009  . Mallory - Weiss tear OCT 2009    AFTER VOMITING FROM GO-LYTELY  . Internal hemorrhoid   :  Past Surgical History  Procedure Laterality Date  . Hemicolectomy  LEFT ?complicated diverticulitis  . Colonoscopy  2009 2O TO LGIB    NL TI, AC POLYP not removed, TICS  . Upper gastrointestinal endoscopy  2009 2o to hematemesis    limited 2o to LARGE MW TEAR, S/P CLIPS x5  :   Current Outpatient Prescriptions  Medication Sig Dispense Refill  . albuterol (VENTOLIN HFA) 108 (90 BASE) MCG/ACT inhaler Inhale 2 puffs into the lungs every 4 (four) hours as needed for wheezing or shortness of breath.  1 Inhaler  3  . amLODipine (NORVASC) 5 MG tablet Take 1 tablet (5 mg  total) by mouth daily.  30 tablet  6  . clopidogrel (PLAVIX) 75 MG tablet Take 75 mg by mouth daily.        . enzalutamide (XTANDI) 40 MG capsule Take 80 mg by mouth 2 (two) times daily.      Marland Kitchen glipiZIDE (GLUCOTROL) 5 MG tablet Take 5 mg by mouth daily.      Marland Kitchen lisinopril (PRINIVIL,ZESTRIL) 2.5 MG tablet Take 2.5 mg by mouth daily.      . megestrol (MEGACE) 40 MG tablet Take 1 tablet (40 mg total) by mouth 2 (two) times daily.  60 tablet  11  . ondansetron (ZOFRAN-ODT) 4 MG disintegrating tablet Take 4 mg by mouth every 8 (eight) hours as needed for nausea or vomiting.       . pantoprazole (PROTONIX) 40 MG tablet Take 40 mg by mouth daily before breakfast.      . promethazine (PHENERGAN) 25 MG suppository Place 1 suppository (25 mg total) rectally every 6 (six) hours as needed for nausea or vomiting.  12 each  0  . promethazine (PHENERGAN) 25 MG tablet Take 1 tablet (25 mg total) by mouth every 6 (six) hours as needed for nausea or vomiting.  30 tablet  0  . simvastatin (ZOCOR) 10 MG tablet Take 10 mg by mouth at bedtime.       No current facility-administered medications for this visit.     No Known Allergies:  Family History  Problem Relation Age of Onset  . Colon cancer Neg Hx   . Colon polyps Neg Hx   . Heart disease Mother   :  History   Social History  . Marital Status: Married    Spouse Name: N/A    Number of Children: N/A  . Years of Education: N/A   Occupational History  . Not on file.   Social History Main Topics  . Smoking status: Former Smoker -- 0.03 packs/day for 3 years    Types: Cigarettes  . Smokeless tobacco: Former Systems developer     Comment: three cigarettas a day  . Alcohol Use: No  . Drug Use: No  . Sexual Activity: Not on file   Other Topics Concern  . Not on file   Social History Narrative  . No narrative on file  :  Constitutional: positive for fatigue and weight loss, negative for chills, fevers and night sweats Eyes: negative for cataracts, icterus and irritation Ears, nose, mouth, throat, and face: negative for hearing loss, sore mouth and sore throat Respiratory: negative for cough, dyspnea on exertion, hemoptysis and sputum Cardiovascular: negative for dyspnea, exertional chest pressure/discomfort and palpitations Gastrointestinal: positive for change in bowel habits, nausea and vomiting, negative for abdominal pain and diarrhea Genitourinary:negative for dysuria, frequency and hematuria Integument/breast: negative for rash and skin color change Hematologic/lymphatic: negative for bleeding, easy bruising and  lymphadenopathy Musculoskeletal:negative for arthralgias, back pain and myalgias Neurological: negative for dizziness, gait problems and speech problems Behavioral/Psych: negative for anxiety and irritability Endocrine: negative for temperature intolerance Allergic/Immunologic: negative for urticaria  Exam: Blood pressure 141/88, pulse 98, temperature 97.3 F (36.3 C), temperature source Oral, resp. rate 18, height 5\' 9"  (1.753 m), weight 204 lb 9.6 oz (92.806 kg), SpO2 100.00%. General appearance: alert, cooperative and appears stated age Throat: lips, mucosa, and tongue normal; teeth and gums normal Neck: no adenopathy, no carotid bruit, no JVD, supple, symmetrical, trachea midline and thyroid not enlarged, symmetric, no tenderness/mass/nodules Back: symmetric, no curvature. ROM normal. No CVA tenderness. Resp: clear  to auscultation bilaterally Chest wall: no tenderness Cardio: regular rate and rhythm, S1, S2 normal, no murmur, click, rub or gallop GI: soft, non-tender; bowel sounds normal; no masses,  no organomegaly Extremities: extremities normal, atraumatic, no cyanosis or edema Pulses: 2+ and symmetric Skin: Skin color, texture, turgor normal. No rashes or lesions Lymph nodes: Cervical, supraclavicular, and axillary nodes normal. Neurologic: Grossly normal   Recent Labs  08/25/13 1101  WBC 5.6  HGB 12.7*  HCT 38.3*  PLT 211      Ct Abdomen Pelvis W Contrast  08/15/2013   CLINICAL DATA:  Vomiting.  Abdominal pain.  Prostate carcinoma.  EXAM: CT ABDOMEN AND PELVIS WITH CONTRAST  TECHNIQUE: Multidetector CT imaging of the abdomen and pelvis was performed using the standard protocol following bolus administration of intravenous contrast.  CONTRAST:  138mL OMNIPAQUE IOHEXOL 300 MG/ML  SOLN  COMPARISON:  07/19/2013  FINDINGS: The liver, gallbladder, pancreas, spleen, adrenal glands, and kidneys are normal in appearance. No evidence hydronephrosis. No soft tissue masses or  lymphadenopathy identified within the abdomen or pelvis.  No evidence of inflammatory process or abnormal fluid collections. No evidence of bowel obstruction. Surgical clips again seen along the greater omentum and left paracolic gutter.  Mixed sclerotic and lytic bone metastases involving the pelvis, lumbar spine, and left rib show no significant change.  IMPRESSION: Stable bone metastases.  No evidence of abdominal or pelvic soft tissue metastatic disease or other acute findings.   Electronically Signed   By: Earle Gell M.D.   On: 08/15/2013 12:04     Assessment and Plan:   73 year old gentleman with the following issues:  1. Castration resistant prostate cancer with metastatic disease to the bone. His initial diagnosis was a Gleason score 3+3 equals 6 with a PSA of 4.3 and a clinical stage TI C. He received external beam radiation utilizing IMRT. He developed metastatic disease in 2011 and treated with androgen deprivation since that time. In July of 2014 developed castration resistant disease was a rising PSA despite castrate levels of testosterone. He was treated with Casodex and subsequently [place]. Although he did have a drop in his PSA initially on Xtandi, he developed for tolerance and potential progression of disease on abdominal CT scan. She'll course of this disease was discussed today with the patient and his family extensively. I have explained to him that his disease is incurable and will likely have to palliate it with different agents. Henri Medal treatments were discussed today including second line hormonal manipulation with ketoconazole and prednisone, a meal and a therapy with Provenge, Zytiga, systemic chemotherapy and bone directed therapy such as Xofigo. Either he is a reasonable candidate for all the above and I think it's very important to sequence his treatments accordingly. Her first up is to allow him to recover from this recent episode with appears to be a gastroenteritis  versus complications related to Combs. A like to repeat a bone scan and have a quick followup in few weeks to discuss these results and determine the next up. If he continues to be asymptomatic from his cancer then Provenge immunotherapy would be the first up. We can follow that with Zytiga and certainly Jules Schick would be an option if he develops increased bone pain.  2. Bone directed therapy: I continue to recommend Xtandi the which he have been receiving him the care of Dr. Karsten Ro.   3. Androgen depravation: I recommend to continue that for the time being.  4. Nausea and vomiting and weight  loss: unclear etiology and I doubt it's related to his cancer. He could be related to his Xtandi which I have recommended that he stops. Seems to be improving and I hope of that we'll continue to be the case in the next few weeks and reassess him at that time.

## 2013-08-25 NOTE — Progress Notes (Signed)
Please see consult note.  

## 2013-08-25 NOTE — Progress Notes (Signed)
Checked in new pt with no financial concerns. °

## 2013-08-26 ENCOUNTER — Other Ambulatory Visit: Payer: Self-pay | Admitting: Family Medicine

## 2013-08-26 LAB — TESTOSTERONE: Testosterone: 37 ng/dL — ABNORMAL LOW (ref 300–890)

## 2013-08-26 LAB — PSA: PSA: 39.47 ng/mL — ABNORMAL HIGH (ref <=4.00)

## 2013-08-26 MED ORDER — ENZALUTAMIDE 40 MG PO CAPS: 80.0000 mg | ORAL_CAPSULE | Freq: Two times a day (BID) | ORAL | Status: DC

## 2013-08-26 NOTE — Telephone Encounter (Signed)
Refill appropriate and filled per protocol. 

## 2013-08-27 ENCOUNTER — Encounter (HOSPITAL_COMMUNITY)
Admission: RE | Admit: 2013-08-27 | Discharge: 2013-08-27 | Disposition: A | Discharge status: Home / Self Care | Payer: Medicare Other | Source: Ambulatory Visit | Attending: Oncology | Admitting: Oncology

## 2013-08-27 ENCOUNTER — Encounter (HOSPITAL_COMMUNITY): Payer: Self-pay

## 2013-08-27 DIAGNOSIS — C61 Malignant neoplasm of prostate: Secondary | ICD-10-CM | POA: Insufficient documentation

## 2013-08-27 DIAGNOSIS — R937 Abnormal findings on diagnostic imaging of other parts of musculoskeletal system: Secondary | ICD-10-CM | POA: Insufficient documentation

## 2013-08-27 MED ORDER — TECHNETIUM TC 99M MEDRONATE IV KIT
25.0000 | PACK | Freq: Once | INTRAVENOUS | Status: AC | PRN
  Administered 2013-08-27: 25 via INTRAVENOUS

## 2013-08-29 ENCOUNTER — Emergency Department (HOSPITAL_COMMUNITY)
Admission: EM | Admit: 2013-08-29 | Discharge: 2013-08-30 | Disposition: A | Discharge status: Home / Self Care | Payer: Medicare Other | Attending: Emergency Medicine | Admitting: Emergency Medicine

## 2013-08-29 ENCOUNTER — Encounter (HOSPITAL_COMMUNITY): Payer: Self-pay | Admitting: Emergency Medicine

## 2013-08-29 ENCOUNTER — Emergency Department (HOSPITAL_COMMUNITY): Payer: Medicare Other

## 2013-08-29 DIAGNOSIS — Z860100 Personal history of colon polyps, unspecified: Secondary | ICD-10-CM | POA: Insufficient documentation

## 2013-08-29 DIAGNOSIS — I951 Orthostatic hypotension: Secondary | ICD-10-CM | POA: Insufficient documentation

## 2013-08-29 DIAGNOSIS — Z8546 Personal history of malignant neoplasm of prostate: Secondary | ICD-10-CM | POA: Insufficient documentation

## 2013-08-29 DIAGNOSIS — K5900 Constipation, unspecified: Secondary | ICD-10-CM | POA: Insufficient documentation

## 2013-08-29 DIAGNOSIS — K625 Hemorrhage of anus and rectum: Secondary | ICD-10-CM | POA: Insufficient documentation

## 2013-08-29 DIAGNOSIS — Z87891 Personal history of nicotine dependence: Secondary | ICD-10-CM | POA: Insufficient documentation

## 2013-08-29 DIAGNOSIS — E119 Type 2 diabetes mellitus without complications: Secondary | ICD-10-CM | POA: Insufficient documentation

## 2013-08-29 DIAGNOSIS — Z7902 Long term (current) use of antithrombotics/antiplatelets: Secondary | ICD-10-CM | POA: Insufficient documentation

## 2013-08-29 DIAGNOSIS — I10 Essential (primary) hypertension: Secondary | ICD-10-CM | POA: Insufficient documentation

## 2013-08-29 DIAGNOSIS — Z79899 Other long term (current) drug therapy: Secondary | ICD-10-CM | POA: Insufficient documentation

## 2013-08-29 NOTE — ED Notes (Signed)
No active rectal bleeding at this time

## 2013-08-29 NOTE — ED Notes (Signed)
PT C/O RECTAL BLEEDING SINCE TONIGHT AFTER A FLEETS ENEMA. PT ALSO C/O CONSTIPATION.

## 2013-08-29 NOTE — ED Provider Notes (Signed)
CSN: [id number]     Arrival date & time 08/29/13  2109 History   This chart was scribed for Delora Fuel, MD by Maree Erie, ED Scribe. The patient was seen in room APA19/APA19. Patient's care was started at 11:25 PM.     Chief Complaint  Patient presents with  . Rectal Bleeding     The history is provided by the patient. No language interpreter was used.    HPI Comments: [name] is a 73 y.o. male with a history of prostate cancer, colon polyp, internal hemorrhoids and diverticulosis who presents to the Emergency Department complaining of an episode of rectal bleeding that occurred tonight. He has associated rectal pain. He noticed blood in the stool tonight after receiving a fleet enema. He states the blood was mixed in the stool and in the water. The wife states he has been constipated so she gave him Magnesium and hot prune juice without relief. She then gave him the enema tonight but states he was only able to have a small BM in which blood was present. His wife states he has had recurrent constipation for months. He recently taken off of the medication Xtandi a few weeks ago by his PCP who thought that may be the cause of it. He has received radiation once for his prostate cancer about nine years ago.   Past Medical History  Diagnosis Date  . Prostate cancer SEP 2011 BONE METS  . CVA (cerebral vascular accident)     off coumadin  . DM (diabetes mellitus)   . HTN (hypertension)   . Diverticulosis 10/09    LGIB  . Schatzki's ring OCT 2009  . Colon polyp OCT 2009  . Mallory - Weiss tear OCT 2009    AFTER VOMITING FROM GO-LYTELY  . Internal hemorrhoid    Past Surgical History  Procedure Laterality Date  . Hemicolectomy  LEFT ?complicated diverticulitis  . Colonoscopy  2009 2O TO LGIB    NL TI, AC POLYP not removed, TICS  . Upper gastrointestinal endoscopy  2009 2o to hematemesis    limited 2o to LARGE MW TEAR, S/P CLIPS x5   Family History  Problem Relation Age of  Onset  . Colon cancer Neg Hx   . Colon polyps Neg Hx   . Heart disease Mother    History  Substance Use Topics  . Smoking status: Former Smoker -- 0.03 packs/day for 3 years    Types: Cigarettes  . Smokeless tobacco: Former Systems developer     Comment: three cigarettas a day  . Alcohol Use: No    Review of Systems  Gastrointestinal: Positive for constipation and blood in stool.  All other systems reviewed and are negative.      Allergies  Review of patient's allergies indicates no known allergies.  Home Medications   Current Outpatient Rx  Name  Route  Sig  Dispense  Refill  . albuterol (VENTOLIN HFA) 108 (90 BASE) MCG/ACT inhaler   Inhalation   Inhale 2 puffs into the lungs every 4 (four) hours as needed for wheezing or shortness of breath.   1 Inhaler   3   . amLODipine (NORVASC) 5 MG tablet   Oral   Take 1 tablet (5 mg total) by mouth daily.   30 tablet   6   . clopidogrel (PLAVIX) 75 MG tablet   Oral   Take 75 mg by mouth daily.           . enzalutamide [name]) 40  MG capsule   Oral   Take 2 capsules (80 mg total) by mouth 2 (two) times daily.   120 capsule   3   . glipiZIDE (GLUCOTROL) 5 MG tablet   Oral   Take 5 mg by mouth daily.         Marland Kitchen lisinopril (PRINIVIL,ZESTRIL) 2.5 MG tablet      TAKE (1) TABLET BY MOUTH ONCE DAILY.   30 tablet   3   . megestrol (MEGACE) 40 MG tablet   Oral   Take 1 tablet (40 mg total) by mouth 2 (two) times daily.   60 tablet   11   . ondansetron (ZOFRAN-ODT) 4 MG disintegrating tablet   Oral   Take 4 mg by mouth every 8 (eight) hours as needed for nausea or vomiting.         . pantoprazole (PROTONIX) 40 MG tablet   Oral   Take 40 mg by mouth daily before breakfast.         . promethazine (PHENERGAN) 25 MG suppository   Rectal   Place 1 suppository (25 mg total) rectally every 6 (six) hours as needed for nausea or vomiting.   12 each   0   . promethazine (PHENERGAN) 25 MG tablet   Oral   Take 1 tablet  (25 mg total) by mouth every 6 (six) hours as needed for nausea or vomiting.   30 tablet   0   . simvastatin (ZOCOR) 10 MG tablet      TAKE ONE TABLET BY MOUTH AT BEDTIME.   30 tablet   3    Triage Vitals: BP 105/40  Pulse 81  Resp 20  Ht 5\' 9"  (1.753 m)  Wt 204 lb 9 oz (92.789 kg)  BMI 30.19 kg/m2  SpO2 99%  Physical Exam  Nursing note and vitals reviewed. Constitutional: He is oriented to person, place, and time. He appears well-developed and well-nourished. No distress.  HENT:  Head: Normocephalic and atraumatic.  Eyes: EOM are normal. Pupils are equal, round, and reactive to light.  Neck: Neck supple. No JVD present.  Cardiovascular: Normal rate and regular rhythm.  Exam reveals no gallop and no friction rub.   No murmur heard. Pulmonary/Chest: Effort normal and breath sounds normal. No respiratory distress. He has no wheezes. He has no rales.  Abdominal: Soft. He exhibits no distension and no mass. There is no tenderness.  Genitourinary:  Rectal: Mild anaa stricture. Moderately tender on exam. No hemorrhoids or masses. Small amount of brown stool present. No impaction. No blood.   Musculoskeletal: He exhibits no edema and no tenderness.  Lymphadenopathy:    He has no cervical adenopathy.  Neurological: He is alert and oriented to person, place, and time. No cranial nerve deficit. Coordination normal.  Skin: Skin is warm and dry. No rash noted.  Psychiatric: He has a normal mood and affect.    ED Course  Procedures (including critical care time)  DIAGNOSTIC STUDIES: Oxygen Saturation is 99% on room air, normal by my interpretation.    COORDINATION OF CARE: 11:31 PM -Will order abdomen x-ray, POC occult blood, CBC with differential, BMP and orthostatic vital signs. Patient verbalizes understanding and agrees with treatment plan.    Labs Review Results for orders placed during the hospital encounter of 08/29/13  CBC WITH DIFFERENTIAL      Result Value Ref Range    WBC 6.4  4.0 - 10.5 K/uL   RBC 4.17 (*) 4.22 - 5.81 MIL/uL  Hemoglobin 12.5 (*) 13.0 - 17.0 g/dL   HCT 36.5 (*) 39.0 - 52.0 %   MCV 87.5  78.0 - 100.0 fL   MCH 30.0  26.0 - 34.0 pg   MCHC 34.2  30.0 - 36.0 g/dL   RDW 13.8  11.5 - 15.5 %   Platelets 204  150 - 400 K/uL   Neutrophils Relative % 75  43 - 77 %   Neutro Abs 4.9  1.7 - 7.7 K/uL   Lymphocytes Relative 15  12 - 46 %   Lymphs Abs 1.0  0.7 - 4.0 K/uL   Monocytes Relative 8  3 - 12 %   Monocytes Absolute 0.5  0.1 - 1.0 K/uL   Eosinophils Relative 1  0 - 5 %   Eosinophils Absolute 0.1  0.0 - 0.7 K/uL   Basophils Relative 0  0 - 1 %   Basophils Absolute 0.0  0.0 - 0.1 K/uL  BASIC METABOLIC PANEL      Result Value Ref Range   Sodium 134 (*) 137 - 147 mEq/L   Potassium 4.4  3.7 - 5.3 mEq/L   Chloride 99  96 - 112 mEq/L   CO2 24  19 - 32 mEq/L   Glucose, Bld 151 (*) 70 - 99 mg/dL   BUN 11  6 - 23 mg/dL   Creatinine, Ser 1.18  0.50 - 1.35 mg/dL   Calcium 10.0  8.4 - 10.5 mg/dL   GFR calc non Af Amer 60 (*) >90 mL/min   GFR calc Af Amer 69 (*) >90 mL/min  HEMOGLOBIN AND HEMATOCRIT, BLOOD      Result Value Ref Range   Hemoglobin 12.0 (*) 13.0 - 17.0 g/dL   HCT 34.5 (*) 39.0 - 52.0 %   Imaging Review: Dg Abd 1 View  08/30/2013   CLINICAL DATA:  Rectal bleeding.  EXAM: ABDOMEN - 1 VIEW  COMPARISON:  08/15/2013  FINDINGS: Fiducial markers present within the prostate. There surgical changes throughout the abdomen. Moderate volume formed stool within the colon. No evidence of small bowel obstruction. Osteoblastic skeletal metastatic disease is better seen on abdominal CT 08/15/2013.  IMPRESSION: Nonobstructive bowel gas pattern.   Electronically Signed   By: Jorje Guild M.D.   On: 08/30/2013 00:45   Images viewed by me.  MDM   Final diagnoses:  Rectal bleeding  Orthostatic hypotension  Constipation    Report of rectal bleeding but no evidence of active bleeding on my exam. He does have a mild anal stricture which is  probably related to prior radiation treatment. Hemoglobin will be checked as well orthostatic vital signs.  Hemoglobin is unchanged from baseline, but orthostatic vital signs do show significant drop in systolic blood pressure without significant change in pulse. He'll be given IV hydration and then orthostatic vital signs repeated.  Repeat orthostatic vital signs continued to show drop in hemoglobin without change in pulse. Hemoglobin is rechecked and is not show any significant change. I suspect that his orthostatic drop in systolic blood pressure is related to some degree of autonomic insufficiency. He is discharged with a prescription for polyethylene glycol and to treat his constipation and is to followup with his PCP for recheck in 1 day.  I personally performed the services described in this documentation, which was scribed in my presence. The recorded information has been reviewed and is accurate.     Delora Fuel, MD 09/73/53 [id number]

## 2013-08-30 LAB — BASIC METABOLIC PANEL
BUN: 11 mg/dL (ref 6–23)
CO2: 24 mEq/L (ref 19–32)
Calcium: 10 mg/dL (ref 8.4–10.5)
Chloride: 99 mEq/L (ref 96–112)
Creatinine, Ser: 1.18 mg/dL (ref 0.50–1.35)
GFR, EST AFRICAN AMERICAN: 69 mL/min — AB (ref >90)
GFR, EST NON AFRICAN AMERICAN: 60 mL/min — AB (ref >90)
Glucose, Bld: 151 mg/dL — ABNORMAL HIGH (ref 70–99)
Potassium: 4.4 mEq/L (ref 3.7–5.3)
Sodium: 134 mEq/L — ABNORMAL LOW (ref 137–147)

## 2013-08-30 LAB — CBC WITH DIFFERENTIAL/PLATELET
BASOS PCT: 0 % (ref 0–1)
Basophils Absolute: 0 10*3/uL (ref 0.0–0.1)
EOS ABS: 0.1 10*3/uL (ref 0.0–0.7)
Eosinophils Relative: 1 % (ref 0–5)
HCT: 36.5 % — ABNORMAL LOW (ref 39.0–52.0)
HEMOGLOBIN: 12.5 g/dL — AB (ref 13.0–17.0)
Lymphocytes Relative: 15 % (ref 12–46)
Lymphs Abs: 1 10*3/uL (ref 0.7–4.0)
MCH: 30 pg (ref 26.0–34.0)
MCHC: 34.2 g/dL (ref 30.0–36.0)
MCV: 87.5 fL (ref 78.0–100.0)
MONOS PCT: 8 % (ref 3–12)
Monocytes Absolute: 0.5 10*3/uL (ref 0.1–1.0)
Neutro Abs: 4.9 10*3/uL (ref 1.7–7.7)
Neutrophils Relative %: 75 % (ref 43–77)
PLATELETS: 204 10*3/uL (ref 150–400)
RBC: 4.17 MIL/uL — ABNORMAL LOW (ref 4.22–5.81)
RDW: 13.8 % (ref 11.5–15.5)
WBC: 6.4 10*3/uL (ref 4.0–10.5)

## 2013-08-30 LAB — HEMOGLOBIN AND HEMATOCRIT, BLOOD
HCT: 34.5 % — ABNORMAL LOW (ref 39.0–52.0)
Hemoglobin: 12 g/dL — ABNORMAL LOW (ref 13.0–17.0)

## 2013-08-30 MED ORDER — SODIUM CHLORIDE 0.9 % IV BOLUS (SEPSIS)
1000.0000 mL | Freq: Once | INTRAVENOUS | Status: AC
  Administered 2013-08-30: 1000 mL via INTRAVENOUS

## 2013-08-30 MED ORDER — PEG 3350-KCL-NABCB-NACL-NASULF 236 G PO SOLR: 4.0000 L | Freq: Once | ORAL | Status: DC

## 2013-08-30 NOTE — Discharge Instructions (Signed)
Constipation, Adult Constipation is when a person has fewer than 3 bowel movements a week; has difficulty having a bowel movement; or has stools that are dry, hard, or larger than normal. As people grow older, constipation is more common. If you try to fix constipation with medicines that make you have a bowel movement (laxatives), the problem may get worse. Long-term laxative use may cause the muscles of the colon to become weak. A low-fiber diet, not taking in enough fluids, and taking certain medicines may make constipation worse. CAUSES   Certain medicines, such as antidepressants, pain medicine, iron supplements, antacids, and water pills.   Certain diseases, such as diabetes, irritable bowel syndrome (IBS), thyroid disease, or depression.   Not drinking enough water.   Not eating enough fiber-rich foods.   Stress or travel.  Lack of physical activity or exercise.  Not going to the restroom when there is the urge to have a bowel movement.  Ignoring the urge to have a bowel movement.  Using laxatives too much. SYMPTOMS   Having fewer than 3 bowel movements a week.   Straining to have a bowel movement.   Having hard, dry, or larger than normal stools.   Feeling full or bloated.   Pain in the lower abdomen.  Not feeling relief after having a bowel movement. DIAGNOSIS  Your caregiver will take a medical history and perform a physical exam. Further testing may be done for severe constipation. Some tests may include:   A barium enema X-ray to examine your rectum, colon, and sometimes, your small intestine.  A sigmoidoscopy to examine your lower colon.  A colonoscopy to examine your entire colon. TREATMENT  Treatment will depend on the severity of your constipation and what is causing it. Some dietary treatments include drinking more fluids and eating more fiber-rich foods. Lifestyle treatments may include regular exercise. If these diet and lifestyle recommendations  do not help, your caregiver may recommend taking over-the-counter laxative medicines to help you have bowel movements. Prescription medicines may be prescribed if over-the-counter medicines do not work.  HOME CARE INSTRUCTIONS   Increase dietary fiber in your diet, such as fruits, vegetables, whole grains, and beans. Limit high-fat and processed sugars in your diet, such as [place] fries, hamburgers, cookies, candies, and soda.   A fiber supplement may be added to your diet if you cannot get enough fiber from foods.   Drink enough fluids to keep your urine clear or pale yellow.   Exercise regularly or as directed by your caregiver.   Go to the restroom when you have the urge to go. Do not hold it.  Only take medicines as directed by your caregiver. Do not take other medicines for constipation without talking to your caregiver first. Shepherd IF:   You have bright red blood in your stool.   Your constipation lasts for more than 4 days or gets worse.   You have abdominal or rectal pain.   You have thin, pencil-like stools.  You have unexplained weight loss. MAKE SURE YOU:   Understand these instructions.  Will watch your condition.  Will get help right away if you are not doing well or get worse. Document Released: 02/16/2004 Document Revised: 08/12/2011 Document Reviewed: 03/01/2013 [employer] Patient Information 2014 [place], [place].  Polyethylene Glycol; Electrolytes oral solution What is this medicine? POLYETHYLENE GLYCOL; ELECTROLYTES (pol ee ETH i leen GLYE col; i LEK truh lahyts) is a laxative. It is used to clean out the bowel before  a colonoscopy. This medicine may be used for other purposes; ask your health care provider or pharmacist if you have questions. COMMON BRAND NAME(S): Colyte, GaviLyte-C, GaviLyte-G, GaviLyte-N, GoLYTELY, NuLYTELY, OCL, Suclear, TriLyte What should I tell my health care provider before I take this medicine? They need  to know if you have any of these conditions: -any chronic disease of the intestine, stomach, or throat -bloating or pain in stomach area -difficulty swallowing -G6PD deficiency -heart disease -kidney disease -low levels of sodium in the blood -phenylketonuria -seizures -an unusual or allergic reaction to polyethylene glycol, other medicines, dyes, or preservatives -pregnant or trying to get pregnant -breast-feeding How should I use this medicine? Take this medicine by mouth. Before using this medicine you or your pharmacist must fill the container with the amount of water indicated on the package label. Chill solution before using to improve taste. Shake well before each dose. Your doctor will tell you what time to start this medicine. Take exactly as directed. You will usually have the first bowel movement about 1 hour after you begin drinking the solution. After that, you will have frequent watery bowel movements. You will need to follow a special diet before your procedure. Talk to your doctor. Do not eat any solid foods for 3 to 4 hours before taking this medicine. A special MedGuide will be given to you by the pharmacist with each prescription and refill. Be sure to read this information carefully each time. Talk to your pediatrician regarding the use of this medicine in children. Special care may be needed. Overdosage: If you think you have taken too much of this medicine contact a poison control center or emergency room at once. NOTE: This medicine is only for you. Do not share this medicine with others. What if I miss a dose? You should talk to your doctor if you are not able to complete the bowel preparation as advised. What may interact with this medicine? Do not take any other medicine by mouth starting one hour before you take this medicine. Talk to your doctor about when to take your other medicines. This medicine may interact with the following medications: -certain medicines  for blood pressure, heart disease, irregular heart beat -certain medicines for kidney disease -certain medicines for seizures like carbamazepine, phenobarbital, phenytoin -diuretics -laxatives -NSAIDS, medicines for pain and inflammation, like ibuprofen or naproxen This list may not describe all possible interactions. Give your health care provider a list of all the medicines, herbs, non-prescription drugs, or dietary supplements you use. Also tell them if you smoke, drink alcohol, or use illegal drugs. Some items may interact with your medicine. What should I watch for while using this medicine? Tell your doctor if you experience any changes in bowel habits that are severe or last longer than three weeks. Do not inhale dust from the solution powder. This can make breathing difficult or may cause sneezing or an allergic-type reaction. Bloating may occur before the first bowel movement. If your discomfort continues while you are drinking the solution, stop drinking the solution temporarily or drink each portion with longer spaces in between until your symptoms disappear. Contact your doctor or health care professional if you have any concerns. What side effects may I notice from receiving this medicine? Side effects that you should report to your doctor or health care professional as soon as possible: -allergic reactions like skin rash, itching or hives, swelling of the face, lips, or tongue -breathing problems -chest tightness -dizziness -fast, irregular heartbeat -headache -  seizures -trouble passing urine or change in the amount of urine -vomiting Side effects that usually do not require medical attention (report to your doctor or health care professional if they continue or are bothersome): -anal irritation -bloating, pain, or distension of the stomach -chills -increased hunger or thirst -nausea -stomach gas -tiredness -trouble sleeping This list may not describe all possible side  effects. Call your doctor for medical advice about side effects. You may report side effects to FDA at 1-800-FDA-1088. Where should I keep my medicine? Keep out of the reach of children. Store the solution in the refrigerator to improve the taste. Do not freeze. Throw away any unused medicine 48 hours after mixing. NOTE: This sheet is a summary. It may not cover all possible information. If you have questions about this medicine, talk to your doctor, pharmacist, or health care provider.  2014, Elsevier/Gold Standard. (2009-10-19 12:51:08)

## 2013-08-30 NOTE — ED Notes (Signed)
Wanted to go to bathroom, escorted pt and stayed with him as his gait was unsteady. Pt had to stop and rest before walking the few feet back to his stretcher. Repeat vs done when back in room. ERMD aware

## 2013-09-07 ENCOUNTER — Ambulatory Visit (INDEPENDENT_AMBULATORY_CARE_PROVIDER_SITE_OTHER): Payer: Medicare Other | Admitting: Family Medicine

## 2013-09-07 ENCOUNTER — Encounter: Payer: Self-pay | Admitting: Family Medicine

## 2013-09-07 VITALS — BP 132/84 | HR 84 | Temp 97.2°F | Resp 16 | Ht 67.5 in | Wt 203.0 lb

## 2013-09-07 DIAGNOSIS — C61 Malignant neoplasm of prostate: Secondary | ICD-10-CM | POA: Insufficient documentation

## 2013-09-07 DIAGNOSIS — R112 Nausea with vomiting, unspecified: Secondary | ICD-10-CM

## 2013-09-07 DIAGNOSIS — R2681 Unsteadiness on feet: Secondary | ICD-10-CM | POA: Insufficient documentation

## 2013-09-07 DIAGNOSIS — E46 Unspecified protein-calorie malnutrition: Secondary | ICD-10-CM

## 2013-09-07 DIAGNOSIS — K5900 Constipation, unspecified: Secondary | ICD-10-CM

## 2013-09-07 DIAGNOSIS — C800 Disseminated malignant neoplasm, unspecified: Secondary | ICD-10-CM

## 2013-09-07 DIAGNOSIS — E119 Type 2 diabetes mellitus without complications: Secondary | ICD-10-CM

## 2013-09-07 DIAGNOSIS — R269 Unspecified abnormalities of gait and mobility: Secondary | ICD-10-CM

## 2013-09-07 MED ORDER — POLYETHYLENE GLYCOL 3350 17 GM/SCOOP PO POWD: 17.0000 g | Freq: Two times a day (BID) | ORAL | Status: DC | PRN

## 2013-09-07 MED ORDER — ONDANSETRON 4 MG PO TBDP: 4.0000 mg | ORAL_TABLET | Freq: Three times a day (TID) | ORAL | Status: DC | PRN

## 2013-09-07 MED ORDER — PROMETHAZINE HCL 25 MG PO TABS: 25.0000 mg | ORAL_TABLET | Freq: Four times a day (QID) | ORAL | Status: AC | PRN

## 2013-09-07 NOTE — Assessment & Plan Note (Signed)
With his weight loss and his hypoglycemia I will stop the glipizide

## 2013-09-07 NOTE — Assessment & Plan Note (Signed)
Miralax once a day

## 2013-09-07 NOTE — Assessment & Plan Note (Signed)
I think the nausea and vomiting was due today chemotherapy drug. His scans have been normal. His labs fairly unremarkable. His vomiting has ceased since he has been off of the medication. Have advised his wife to she can continue the Zofran and she can get before meal if he is nauseous.

## 2013-09-07 NOTE — Assessment & Plan Note (Signed)
He's lost 15 pounds due to poor intake. He will continue to ensure a least twice a day. Small meals. He is on Megace which was being used to help with his cancer treatments. We may need to consider Remeron in him.

## 2013-09-07 NOTE — Assessment & Plan Note (Signed)
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 I will discuss with his oncologist the next step. He was to proceed with another treatment if possible.

## 2013-09-07 NOTE — Progress Notes (Signed)
Patient ID: [name], male   DOB: Jul 13, 1940, 73 y.o.   MRN: [id number]   Subjective:    Patient ID: [name], male    DOB: Jun 20, 1940, 73 y.o.   MRN: [id number]  Patient presents for Hospital F/U  patient here to followup hospital visit. She's been seen in the ER multiple times secondary to nausea vomiting. He will was seen by his oncologist who and that stopping the [facility] chemotherapy drug which has improved his symptoms. He still occasionally gets some nausea but his appetite is slowly trying to improve. He is actually lost 15 pounds since her last visit in February. He did have a CT scan of abdomen and pelvis which showed increased metastatic lesions to the bone but no acute abdominal pathology. His bone scan which was done last month by oncology showed also new lesions. He is upset because he thinks he's been told different things by his urologist in his oncologist about his Street meds. I tried to explain to him that there is no cure for his metastatic prostate cancer and that the medications will be more to help ease his symptoms of pain and try to slow the progression of the prostate cancer.  His wife does note that his blood sugar dropped a couple times as well he has been taking the glipizide once a day as previously prescribed. He's also been having difficulties with his bowels he tried giving him an enema after he had taken some laxatives which is not causing some mild rectal bleeding he was seen in emergency room for this.    Review Of Systems:  GEN- denies fatigue, fever, weight loss,weakness, recent illness HEENT- denies eye drainage, change in vision, nasal discharge, CVS- denies chest pain, palpitations RESP- denies SOB, cough, wheeze ABD- + N/ no V, change in stools, abd pain GU- denies dysuria, hematuria, dribbling, incontinence Neuro- denies headache, dizziness, syncope, seizure activity       Objective:    BP 132/84  Pulse 84  Temp(Src) 97.2 F (36.2 C)  (Oral)  Resp 16  Ht 5' 7.5" (1.715 m)  Wt 203 lb (92.08 kg)  BMI 31.31 kg/m2 GEN- NAD, alert and oriented x3, +weight loss HEENT- PERRL, EOMI, non injected sclera, pink conjunctiva, MMM, oropharynx clear Neck- Supple, no LAD CVS- RRR, no murmur RESP-CTAB ABD-NABS,soft,NT,ND EXT- No edema Pulses- Radial 2+        Assessment & Plan:      Problem List Items Addressed This Visit   None      Note: This dictation was prepared with Dragon dictation along with smaller phrase technology. Any transcriptional errors that result from this process are unintentional.

## 2013-09-07 NOTE — Patient Instructions (Signed)
Give him Zofran before meals  Take miralax 1 scoopful once a day for constipation I will take to Dr. Osker Mason We will call with blood work STOP THE GLIPIZIDE F/U 2 months

## 2013-09-08 ENCOUNTER — Telehealth: Payer: Self-pay | Admitting: Oncology

## 2013-09-08 LAB — HEMOGLOBIN A1C
Hgb A1c MFr Bld: 5.8 % — ABNORMAL HIGH (ref <5.7)
Mean Plasma Glucose: 120 mg/dL — ABNORMAL HIGH (ref <117)

## 2013-09-08 LAB — CBC WITH DIFFERENTIAL/PLATELET
BASOS PCT: 0 % (ref 0–1)
Basophils Absolute: 0 10*3/uL (ref 0.0–0.1)
EOS ABS: 0.3 10*3/uL (ref 0.0–0.7)
EOS PCT: 5 % (ref 0–5)
HEMATOCRIT: 37.2 % — AB (ref 39.0–52.0)
HEMOGLOBIN: 12.5 g/dL — AB (ref 13.0–17.0)
Lymphocytes Relative: 38 % (ref 12–46)
Lymphs Abs: 2.2 10*3/uL (ref 0.7–4.0)
MCH: 29 pg (ref 26.0–34.0)
MCHC: 33.6 g/dL (ref 30.0–36.0)
MCV: 86.3 fL (ref 78.0–100.0)
MONO ABS: 0.4 10*3/uL (ref 0.1–1.0)
MONOS PCT: 7 % (ref 3–12)
Neutro Abs: 2.9 10*3/uL (ref 1.7–7.7)
Neutrophils Relative %: 50 % (ref 43–77)
Platelets: 239 10*3/uL (ref 150–400)
RBC: 4.31 MIL/uL (ref 4.22–5.81)
RDW: 15.3 % (ref 11.5–15.5)
WBC: 5.8 10*3/uL (ref 4.0–10.5)

## 2013-09-08 LAB — COMPREHENSIVE METABOLIC PANEL
ALK PHOS: 92 U/L (ref 39–117)
ALT: <8 U/L (ref 0–53)
AST: 13 U/L (ref 0–37)
Albumin: 4 g/dL (ref 3.5–5.2)
BILIRUBIN TOTAL: 0.4 mg/dL (ref 0.2–1.2)
BUN: 13 mg/dL (ref 6–23)
CO2: 24 mEq/L (ref 19–32)
Calcium: 9.5 mg/dL (ref 8.4–10.5)
Chloride: 106 mEq/L (ref 96–112)
Creat: 1.23 mg/dL (ref 0.50–1.35)
GLUCOSE: 172 mg/dL — AB (ref 70–99)
Potassium: 3.9 mEq/L (ref 3.5–5.3)
Sodium: 136 mEq/L (ref 135–145)
Total Protein: 6.7 g/dL (ref 6.0–8.3)

## 2013-09-08 NOTE — Telephone Encounter (Signed)
per FS req to move pt appt to 2:00.Cld pt and adv of new time same date-pt understood

## 2013-09-10 ENCOUNTER — Telehealth: Payer: Self-pay | Admitting: *Deleted

## 2013-09-10 NOTE — Telephone Encounter (Signed)
Received call from patient wife.   Reported that his DM medications have been changed by MD, and his current CBG reading is 249. States that he is in no distress at this time.   Patient has eaten a bowl of cereal this morning, and his FSBS on 09/09/2013 was 146.   Advised to give patient 8 ounces of water and to repeat FSBS in 1 hour.   MD to be made aware.

## 2013-09-10 NOTE — Telephone Encounter (Signed)
Call returned to patient.   Reported that patient CBG noted to have come down to 145.  Advised to continue low sugar diet and to give plenty of fluids.   Verbalized understanding.

## 2013-09-10 NOTE — Telephone Encounter (Signed)
LMTRC

## 2013-09-10 NOTE — Telephone Encounter (Signed)
Noted, he was taken off the glipizide due to weight loss, hypoglycemia, A1C is 5.8% Just low sugar diet, plenty of fluids

## 2013-09-21 ENCOUNTER — Ambulatory Visit: Payer: Medicare Other | Admitting: Oncology

## 2013-09-22 ENCOUNTER — Telehealth: Payer: Self-pay | Admitting: Family Medicine

## 2013-09-22 MED ORDER — MIRTAZAPINE 7.5 MG PO TABS: 7.5000 mg | ORAL_TABLET | Freq: Every day | ORAL | Status: DC

## 2013-09-22 NOTE — Telephone Encounter (Signed)
Pharmacy [facility] (please deliver) Pt wife is calling because she wants to know if Dr [name] can call something in for her husband because he is not wanting to eat.

## 2013-09-22 NOTE — Telephone Encounter (Signed)
Start remeron 7.5mg  at bedtime , okay to send new script

## 2013-09-22 NOTE — Telephone Encounter (Signed)
MD please advise

## 2013-09-22 NOTE — Telephone Encounter (Signed)
Prescription sent to pharmacy.  LMTRC ?

## 2013-09-23 NOTE — Telephone Encounter (Signed)
Call placed to patient and patient made aware.  

## 2013-09-27 ENCOUNTER — Telehealth: Payer: Self-pay | Admitting: *Deleted

## 2013-09-27 NOTE — Telephone Encounter (Signed)
Call placed to patient and patient made aware.  

## 2013-09-27 NOTE — Telephone Encounter (Signed)
Just watch his sugar intake, increase the water, this will help control his sugar I think the meds are too much for him right now and will cause more hypoglycemia symptoms

## 2013-09-27 NOTE — Telephone Encounter (Signed)
Received call from patient wife, [name].   Reports that since Glipizide has been stopped, his FSBS has been elevated.   Fasting readings noted as follows: 4/25- 209 4/26- 182 4/27- 179.  Reports that she is worried his FSBS will remain elevated.   Advised that glipizide was stopped d/t patient noted with decreased A1C.   Requested for MD to please advise.

## 2013-09-28 ENCOUNTER — Ambulatory Visit (HOSPITAL_BASED_OUTPATIENT_CLINIC_OR_DEPARTMENT_OTHER): Payer: Medicare Other | Admitting: Oncology

## 2013-09-28 ENCOUNTER — Encounter: Payer: Self-pay | Admitting: Oncology

## 2013-09-28 ENCOUNTER — Telehealth: Payer: Self-pay | Admitting: Oncology

## 2013-09-28 VITALS — BP 139/70 | HR 97 | Temp 97.8°F | Resp 18 | Ht 67.5 in | Wt 187.8 lb

## 2013-09-28 DIAGNOSIS — C61 Malignant neoplasm of prostate: Secondary | ICD-10-CM

## 2013-09-28 DIAGNOSIS — C7951 Secondary malignant neoplasm of bone: Secondary | ICD-10-CM

## 2013-09-28 DIAGNOSIS — C7952 Secondary malignant neoplasm of bone marrow: Secondary | ICD-10-CM

## 2013-09-28 MED ORDER — ABIRATERONE ACETATE 250 MG PO TABS: 1000.0000 mg | ORAL_TABLET | Freq: Every day | ORAL | Status: DC

## 2013-09-28 NOTE — Telephone Encounter (Signed)
Gave pt appt for May 2015 lab and MD °

## 2013-09-28 NOTE — Progress Notes (Signed)
Hematology and Oncology Follow Up Visit  [name] [id number] [birth year] 73 y.o. 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 He was initially diagnosed in 2007 with a Gleason score 3+3 equals 6 PSA 4.3 clinical stage TI C.   Prior Therapy: He was treated with external beam radiation utilizing IMRT that was completed in September of 2007. His PSA nadir appears to be around 1.9 in November of 2009. He developed recurrent disease in 2011 with a PSA up to 15.28 and subsequently was found to have bony metastasis including a sclerotic lesion in the chest area. He was treated with androgen deprivation in the form of Degarelix under the care of Dr. Karsten Ro and responded nicely to his bony metastasis including the resolution of a right first rib metastasis In July of 2014, he developed castration resistant disease with a PSA rise up to 13.89 despite castrate levels testosterone of 15. He was started on Xtandi in October of 2014 with a PSA of 22.89. PSA dropped down in January 2015 to 16.93. Was recently his PSA went up to 39.4 on 08/25/2013 [name] was discontinued due to to report tolerance as well.     Current therapy: Under evaluation for the next line of therapy.  Interim History: Mr. [name] presents today for a followup visit. He is a pleasant 73 year old gentleman I saw in consultation in March of 2015. At that time, he was still on Xtandi but doing poorly with that at that time. He is reporting nausea and weight loss. He is reporting failure to thrive as well. He was seen in the emergency department on 08/29/2013 but stopping the Xtandi have helped his symptoms per his report. He is not reporting any nausea or vomiting at this time. His intake orally have improved slightly but according to his wife still not good enough at this time. His activity levels  have improved and he was mowing the lawn without any difficulty in the last 24 hours. He did report hip pain afterwards that have subsequently subsided. His performance status still limited but slightly improved.  Medications: I have reviewed the patient's current medications.  Current Outpatient Prescriptions  Medication Sig Dispense Refill  . abiraterone Acetate (ZYTIGA) 250 MG tablet Take 4 tablets (1,000 mg total) by mouth daily. Take on an empty stomach 1 hour before or 2 hours after a meal  120 tablet  0  . albuterol (VENTOLIN HFA) 108 (90 BASE) MCG/ACT inhaler Inhale 2 puffs into the lungs every 4 (four) hours as needed for wheezing or shortness of breath.  1 Inhaler  3  . amLODipine (NORVASC) 5 MG tablet Take 1 tablet (5 mg total) by mouth daily.  30 tablet  6  . clopidogrel (PLAVIX) 75 MG tablet Take 75 mg by mouth daily.        Marland Kitchen lisinopril (PRINIVIL,ZESTRIL) 2.5 MG tablet Take 2.5 mg by mouth daily.      . megestrol (MEGACE) 40 MG tablet Take 1 tablet (40 mg total) by mouth 2 (two) times daily.  60 tablet  11  . mirtazapine (REMERON) 7.5 MG tablet Take 1 tablet (7.5 mg total) by mouth at bedtime.  30 tablet  2  . ondansetron (ZOFRAN-ODT) 4 MG disintegrating tablet Take 1 tablet (4 mg total) by mouth every 8 (eight) hours as needed for nausea or vomiting.  20 tablet  2  . pantoprazole (PROTONIX) 40  MG tablet Take 40 mg by mouth daily before breakfast.      . polyethylene glycol (GOLYTELY) 236 G solution Take 4,000 mLs by mouth once. Drink 8 ounces every 15 minutes until you are passing clear liquid.  4000 mL  0  . polyethylene glycol powder (GLYCOLAX/MIRALAX) powder Take 17 g by mouth 2 (two) times daily as needed.  3350 g  6  . promethazine (PHENERGAN) 25 MG suppository Place 1 suppository (25 mg total) rectally every 6 (six) hours as needed for nausea or vomiting.  12 each  0  . promethazine (PHENERGAN) 25 MG tablet Take 1 tablet (25 mg total) by mouth every 6 (six) hours as needed for  nausea or vomiting.  30 tablet  3  . simvastatin (ZOCOR) 10 MG tablet Take 10 mg by mouth at bedtime.       No current facility-administered medications for this visit.     Allergies: No Known Allergies  Past Medical History, Surgical history, Social history, and Family History were reviewed and updated.  Review of Systems: Constitutional:  Negative for fever, chills, night sweats, anorexia, weight loss, pain. Cardiovascular: no chest pain or dyspnea on exertion Respiratory: no cough, shortness of breath, or wheezing Neurological: negative Dermatological: negative for acne and rash ENT: negative for - headaches, hearing change or oral lesions Skin: Negative. Gastrointestinal: no abdominal pain, change in bowel habits, or black or bloody stools Genito-Urinary: no dysuria, trouble voiding, or hematuria Hematological and Lymphatic: negative Breast: negative Musculoskeletal: negative for - gait disturbance, joint stiffness or muscular weakness Remaining ROS negative. Physical Exam: Blood pressure 139/70, pulse 97, temperature 97.8 F (36.6 C), temperature source Oral, resp. rate 18, height 5' 7.5" (1.715 m), weight 187 lb 12.8 oz (85.186 kg). ECOG: 1 General appearance: alert and cooperative Head: Normocephalic, without obvious abnormality, atraumatic Neck: no adenopathy, no carotid bruit, no JVD, supple, symmetrical, trachea midline and thyroid not enlarged, symmetric, no tenderness/mass/nodules Lymph nodes: Cervical, supraclavicular, and axillary nodes normal. Heart:regular rate and rhythm, S1, S2 normal, no murmur, click, rub or gallop Lung:chest clear, no wheezing, rales, normal symmetric air entry Abdomin: soft, non-tender, without masses or organomegaly EXT:no erythema, induration, or nodules   Lab Results: Lab Results  Component Value Date   WBC 5.8 09/07/2013   HGB 12.5* 09/07/2013   HCT 37.2* 09/07/2013   MCV 86.3 09/07/2013   PLT 239 09/07/2013     Chemistry       Component Value Date/Time   NA 136 09/07/2013 1118   NA 139 08/25/2013 1101   K 3.9 09/07/2013 1118   K 4.0 08/25/2013 1101   CL 106 09/07/2013 1118   CO2 24 09/07/2013 1118   CO2 19* 08/25/2013 1101   BUN 13 09/07/2013 1118   BUN 8.7 08/25/2013 1101   CREATININE 1.23 09/07/2013 1118   CREATININE 1.18 08/29/2013 2346   CREATININE 1.1 08/25/2013 1101      Component Value Date/Time   CALCIUM 9.5 09/07/2013 1118   CALCIUM 10.0 08/25/2013 1101   ALKPHOS 92 09/07/2013 1118   ALKPHOS 107 08/25/2013 1101   AST 13 09/07/2013 1118   AST 13 08/25/2013 1101   ALT <8 09/07/2013 1118   ALT 7 08/25/2013 1101   BILITOT 0.4 09/07/2013 1118   BILITOT 0.41 08/25/2013 1101        Impression and Plan:   73 year old gentleman with the following issues:  1. 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 His initial diagnosis was in 2007 with a Gleason  score 3+3 equals 6. His most recent PSA was up to 39.47 with a doubling time of less than 3 months. Options of treatments were discussed today with the patient and his family. He had a poor tolerance to [place] also one could argue that his PSA was rising on it in any case. Options of treatments were discussed again in detail. These would include Provenge immunotherapy which I have advised against. I feel that his tumor is rapidly progressing and he is quite symptomatic from his cancer and he might not benefit at this point from delaying definitive therapy. Other options would include Zytiga oral agent versus systemic chemotherapy. Risks and benefits of both his options were discussed and he is agreeable to proceed with Zytiga. Risks and benefits of the stroke was discussed complications include adrenal insufficiency, electrolyte imbalance, hepatotoxicity, GI toxicity among others were discussed in detail. I will hold off on using prednisone at this time given his diabetes issue. We can certainly have prednisone down the line if needed to. Systemic chemotherapy with  Taxotere was also discussed he would like to defer that in the future if that's possible. I plan to reevaluate him in about 3-4 weeks after his been on Zytiga to assess response.  2. Bone directed therapy: He was probably benefit from that in the near future but we will defer that since his having dental surgery in the near future and we can certainly evaluate that for him in the future.   Wyatt Portela, MD 4/28/201512:17 PM

## 2013-09-28 NOTE — Progress Notes (Signed)
Per MD, Zytiga prescription given to [name] in managed care for preauthorization.

## 2013-10-08 NOTE — Progress Notes (Signed)
EBONY SENT ZYTIGA RX TO [facility] ON 09/28/13.  09/29/13 SHE COMPLETED THE PRIOR AUTHORIZATION FORM AND FAXED IT TO [facility] [place] @ [phone].  10/08/13 FAX FROM [employer] STATING ZYTIGA HAS BEEN APPROVED FROM 4/28/154 TO 03/30/14.  TICKET # [id number]. HIS ID # IS [id number].  CALLED [facility] AND IT HAS ALREADY BEEN PICKED UP.

## 2013-10-19 ENCOUNTER — Telehealth: Payer: Self-pay | Admitting: Medical Oncology

## 2013-10-19 ENCOUNTER — Other Ambulatory Visit: Payer: Self-pay | Admitting: Medical Oncology

## 2013-10-19 MED ORDER — OXYCODONE HCL 5 MG PO TABS
5.0000 mg | ORAL_TABLET | ORAL | Status: DC | PRN
Start: 2013-10-19 — End: 2014-01-18

## 2013-10-19 NOTE — Telephone Encounter (Signed)
LVMOM for patient that pain prescription ready for p.u at clinic and that ID is needed for the prescription p.u.

## 2013-10-19 NOTE — Telephone Encounter (Signed)
Patient's wife called requesting something for pain for patient. States pt is having "a lot of bone pain and he has nothing for pain." Informed spouse will review with MD and call her back.   LOV 09/28/13  Sched 10/27/13 lab/MD

## 2013-10-19 NOTE — Telephone Encounter (Signed)
Oxycodone 5 q4 hours. # 30

## 2013-10-27 ENCOUNTER — Other Ambulatory Visit (HOSPITAL_BASED_OUTPATIENT_CLINIC_OR_DEPARTMENT_OTHER): Payer: Medicare Other

## 2013-10-27 ENCOUNTER — Ambulatory Visit (HOSPITAL_BASED_OUTPATIENT_CLINIC_OR_DEPARTMENT_OTHER): Payer: Medicare Other | Admitting: Oncology

## 2013-10-27 ENCOUNTER — Encounter: Payer: Self-pay | Admitting: Oncology

## 2013-10-27 ENCOUNTER — Telehealth: Payer: Self-pay | Admitting: Oncology

## 2013-10-27 VITALS — BP 140/93 | HR 89 | Temp 97.2°F | Resp 18 | Ht 67.5 in | Wt 197.1 lb

## 2013-10-27 DIAGNOSIS — C61 Malignant neoplasm of prostate: Secondary | ICD-10-CM

## 2013-10-27 DIAGNOSIS — C7952 Secondary malignant neoplasm of bone marrow: Secondary | ICD-10-CM

## 2013-10-27 DIAGNOSIS — C7951 Secondary malignant neoplasm of bone: Secondary | ICD-10-CM

## 2013-10-27 LAB — COMPREHENSIVE METABOLIC PANEL (CC13)
ALBUMIN: 3.3 g/dL — AB (ref 3.5–5.0)
ALT: 16 U/L (ref 0–55)
ANION GAP: 11 meq/L (ref 3–11)
AST: 21 U/L (ref 5–34)
Alkaline Phosphatase: 155 U/L — ABNORMAL HIGH (ref 40–150)
BUN: 14.3 mg/dL (ref 7.0–26.0)
CO2: 20 meq/L — AB (ref 22–29)
Calcium: 9.8 mg/dL (ref 8.4–10.4)
Chloride: 110 mEq/L — ABNORMAL HIGH (ref 98–109)
Creatinine: 1 mg/dL (ref 0.7–1.3)
GLUCOSE: 107 mg/dL (ref 70–140)
POTASSIUM: 4 meq/L (ref 3.5–5.1)
Sodium: 141 mEq/L (ref 136–145)
TOTAL PROTEIN: 7.2 g/dL (ref 6.4–8.3)
Total Bilirubin: 0.47 mg/dL (ref 0.20–1.20)

## 2013-10-27 LAB — CBC WITH DIFFERENTIAL/PLATELET
BASO%: 0.3 % (ref 0.0–2.0)
Basophils Absolute: 0 10*3/uL (ref 0.0–0.1)
EOS ABS: 0.3 10*3/uL (ref 0.0–0.5)
EOS%: 4.4 % (ref 0.0–7.0)
HCT: 32.2 % — ABNORMAL LOW (ref 38.4–49.9)
HEMOGLOBIN: 10.5 g/dL — AB (ref 13.0–17.1)
LYMPH%: 31.1 % (ref 14.0–49.0)
MCH: 29.2 pg (ref 27.2–33.4)
MCHC: 32.6 g/dL (ref 32.0–36.0)
MCV: 89.5 fL (ref 79.3–98.0)
MONO#: 0.5 10*3/uL (ref 0.1–0.9)
MONO%: 8.5 % (ref 0.0–14.0)
NEUT%: 55.7 % (ref 39.0–75.0)
NEUTROS ABS: 3.4 10*3/uL (ref 1.5–6.5)
Platelets: 342 10*3/uL (ref 140–400)
RBC: 3.6 10*6/uL — ABNORMAL LOW (ref 4.20–5.82)
RDW: 14.8 % — AB (ref 11.0–14.6)
WBC: 6 10*3/uL (ref 4.0–10.3)
lymph#: 1.9 10*3/uL (ref 0.9–3.3)

## 2013-10-27 NOTE — Progress Notes (Signed)
Hematology and Oncology Follow Up Visit  [name] [id number] [birth year] 73 y.o. 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 He was initially diagnosed in 2007 with a Gleason score 3+3 equals 6 PSA 4.3 clinical stage TI C.   Prior Therapy: He was treated with external beam radiation utilizing IMRT that was completed in September of 2007. His PSA nadir appears to be around 1.9 in November of 2009. He developed recurrent disease in 2011 with a PSA up to 15.28 and subsequently was found to have bony metastasis including a sclerotic lesion in the chest area. He was treated with androgen deprivation in the form of Degarelix under the care of Dr. Karsten Ro and responded nicely to his bony metastasis including the resolution of a right first rib metastasis. In July of 2014, he developed castration resistant disease with a PSA rise up to 13.89 despite castrate levels testosterone of 15. He was started on Xtandi in October of 2014 with a PSA of 22.89. PSA dropped down in January 2015 to 16.93. Was recently his PSA went up to 39.4 on 08/25/2013 [name] was discontinued due to to report tolerance as well.     Current therapy: Zytiga 1000 mg by mouth daily started in April of 2015.  Interim History: [name] presents today for a followup visit. Since last visit, he was started on Zytiga and tolerated it very well. He is no longer reporting any nausea or vomiting. Has not reported any further weight loss. His appetite has improved dramatically. He has not reported any abdominal distention or GI symptoms. Has not reported any lower extremity edema. His performance status is improved dramatically and really not reporting any more bone pain. He had one episode of increased leg pain that required oxycodone but none since that time. His performance status and quality of  life has improved dramatically since the last visit. It is unclear if this is related to [place] starting or Xtandi stopping. He does not report any headaches blurred vision or double vision. He does not report any fevers or chills or sweats. He does not report any chest pain shortness of breath cough or hemoptysis. Does not report any frequency urgency or hematuria. Does not report any skin rashes or lesions. Does not report any lymphadenopathy or bleeding tendencies. His rest of his review of system is unremarkable. He is reporting strict adherence to Zytiga at this time.  Medications: I have reviewed the patient's current medications. Unchanged by my review. Current Outpatient Prescriptions  Medication Sig Dispense Refill  . abiraterone Acetate (ZYTIGA) 250 MG tablet Take 4 tablets (1,000 mg total) by mouth daily. Take on an empty stomach 1 hour before or 2 hours after a meal  120 tablet  0  . albuterol (VENTOLIN HFA) 108 (90 BASE) MCG/ACT inhaler Inhale 2 puffs into the lungs every 4 (four) hours as needed for wheezing or shortness of breath.  1 Inhaler  3  . amLODipine (NORVASC) 5 MG tablet Take 1 tablet (5 mg total) by mouth daily.  30 tablet  6  . clopidogrel (PLAVIX) 75 MG tablet Take 75 mg by mouth daily.        Marland Kitchen lisinopril (PRINIVIL,ZESTRIL) 2.5 MG tablet Take 2.5 mg by mouth daily.      . megestrol (MEGACE) 40 MG tablet Take 1 tablet (40 mg total) by mouth 2 (two) times daily.  60 tablet  11  . mirtazapine (REMERON) 7.5 MG tablet Take 1 tablet (7.5 mg total) by mouth at bedtime.  30 tablet  2  . ondansetron (ZOFRAN-ODT) 4 MG disintegrating tablet Take 1 tablet (4 mg total) by mouth every 8 (eight) hours as needed for nausea or vomiting.  20 tablet  2  . oxyCODONE (OXY IR/ROXICODONE) 5 MG immediate release tablet Take 1 tablet (5 mg total) by mouth every 4 (four) hours as needed for severe pain.  30 tablet  0  . pantoprazole (PROTONIX) 40 MG tablet Take 40 mg by mouth daily before breakfast.       . polyethylene glycol (GOLYTELY) 236 G solution Take 4,000 mLs by mouth once. Drink 8 ounces every 15 minutes until you are passing clear liquid.  4000 mL  0  . polyethylene glycol powder (GLYCOLAX/MIRALAX) powder Take 17 g by mouth 2 (two) times daily as needed.  3350 g  6  . promethazine (PHENERGAN) 25 MG suppository Place 1 suppository (25 mg total) rectally every 6 (six) hours as needed for nausea or vomiting.  12 each  0  . promethazine (PHENERGAN) 25 MG tablet Take 1 tablet (25 mg total) by mouth every 6 (six) hours as needed for nausea or vomiting.  30 tablet  3  . simvastatin (ZOCOR) 10 MG tablet Take 10 mg by mouth at bedtime.       No current facility-administered medications for this visit.     Allergies: No Known Allergies  Past Medical History, Surgical history, Social history, and Family History were reviewed and updated.  Physical Exam: Blood pressure 140/93, pulse 89, temperature 97.2 F (36.2 C), temperature source Oral, resp. rate 18, height 5' 7.5" (1.715 m), weight 197 lb 1.6 oz (89.404 kg), SpO2 98.00%. ECOG: 1 General appearance: alert and cooperative not in any distress Head: Normocephalic, without obvious abnormality, atraumatic Neck: no adenopathy, no masses or Lymph nodes: Cervical, supraclavicular, and axillary nodes normal. Heart:regular rate and rhythm, S1, S2 normal, no murmur, click, rub or gallop Lung:chest clear, no wheezing, rales, normal symmetric air entry no dullness to percussion Abdomin: soft, non-tender, without masses or organomegaly no shifting dose or ascites.  Neurological examination showed no deficits. EXT:no erythema, induration, or nodules   Lab Results: Lab Results  Component Value Date   WBC 6.0 10/27/2013   HGB 10.5* 10/27/2013   HCT 32.2* 10/27/2013   MCV 89.5 10/27/2013   PLT 342 10/27/2013     Chemistry      Component Value Date/Time   NA 136 09/07/2013 1118   NA 139 08/25/2013 1101   K 3.9 09/07/2013 1118   K 4.0 08/25/2013  1101   CL 106 09/07/2013 1118   CO2 24 09/07/2013 1118   CO2 19* 08/25/2013 1101   BUN 13 09/07/2013 1118   BUN 8.7 08/25/2013 1101   CREATININE 1.23 09/07/2013 1118   CREATININE 1.18 08/29/2013 2346   CREATININE 1.1 08/25/2013 1101      Component Value Date/Time   CALCIUM 9.5 09/07/2013 1118   CALCIUM 10.0 08/25/2013 1101   ALKPHOS 92 09/07/2013 1118   ALKPHOS 107 08/25/2013 1101   AST 13 09/07/2013 1118   AST 13 08/25/2013 1101   ALT <8 09/07/2013 1118   ALT 7 08/25/2013 1101   BILITOT 0.4 09/07/2013 1118   BILITOT 0.41 08/25/2013 1101        Impression and Plan:   73 year old gentleman with the following issues:  1. 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 His  initial diagnosis was in 2007 with a Gleason score 3+3 equals 6. His most recent PSA was up to 39.47 with a doubling time of less than 3 months. He is currently on Zytiga and tolerating it very well without any complications. His PSA is currently pending from today that he had an excellent clinical response I anticipate continuing this medication for the time being.  2. Bone directed therapy: He was probably benefit from that in the near future but we will defer that since his having dental surgery in the near future and we can certainly evaluate that for him in the future.  3. Anemia: Likely related to malignancy I will continue to monitor closely.  4. Androgen depravation: I recommended that continues this under the care of Dr. Karsten Ro.  Wyatt Portela, MD 5/27/20159:38 AM

## 2013-10-27 NOTE — Telephone Encounter (Signed)
gv and printed appt sched and avs fo rpt for July °

## 2013-11-01 ENCOUNTER — Other Ambulatory Visit: Payer: Self-pay | Admitting: Family Medicine

## 2013-11-01 NOTE — Telephone Encounter (Signed)
Medication refilled per protocol. 

## 2013-11-05 ENCOUNTER — Other Ambulatory Visit: Payer: Self-pay | Admitting: Oncology

## 2013-11-05 DIAGNOSIS — C61 Malignant neoplasm of prostate: Secondary | ICD-10-CM

## 2013-11-08 ENCOUNTER — Ambulatory Visit (INDEPENDENT_AMBULATORY_CARE_PROVIDER_SITE_OTHER): Payer: Medicare Other | Admitting: Family Medicine

## 2013-11-08 ENCOUNTER — Encounter: Payer: Self-pay | Admitting: Family Medicine

## 2013-11-08 VITALS — BP 132/82 | HR 80 | Temp 97.7°F | Resp 14 | Ht 68.0 in | Wt 198.0 lb

## 2013-11-08 DIAGNOSIS — E46 Unspecified protein-calorie malnutrition: Secondary | ICD-10-CM

## 2013-11-08 DIAGNOSIS — I10 Essential (primary) hypertension: Secondary | ICD-10-CM

## 2013-11-08 DIAGNOSIS — E119 Type 2 diabetes mellitus without complications: Secondary | ICD-10-CM

## 2013-11-08 DIAGNOSIS — C61 Malignant neoplasm of prostate: Secondary | ICD-10-CM

## 2013-11-08 DIAGNOSIS — C800 Disseminated malignant neoplasm, unspecified: Secondary | ICD-10-CM

## 2013-11-08 NOTE — Progress Notes (Signed)
Patient ID: [name], male   DOB: [birth year], 73 y.o.   MRN: [id number]       Subjective:    Patient ID: [name], male    DOB: [birth year], 73 y.o.   MRN: [id number]  Patient presents for 2 month F/U  patient here for interim followup. Since her last visit he has gained a significant amount of weight he is now on 298 pounds from 187. His wife actually discontinue the Remeron as his appetite improved when he came off of the previous chemotherapy therapeutic trial of he's not had any nausea vomiting since then as well. He is on a new medication regarding his metastatic prostate cancer he continues to have in trend deprivation by his urologist. His blood sugars seem to fluctuate from high to low. His last A1c was 5.8%  therefore I discontinued his glipizide    Review Of Systems:  GEN- denies fatigue, fever, weight loss,weakness, recent illness HEENT- denies eye drainage, change in vision, nasal discharge, CVS- denies chest pain, palpitations RESP- denies SOB, cough, wheeze ABD- denies N/V, change in stools, abd pain GU- denies dysuria, hematuria, dribbling, incontinence MSK- denies joint pain, muscle aches, injury Neuro- denies headache, dizziness, syncope, seizure activity       Objective:    BP 132/82  Pulse 80  Temp(Src) 97.7 F (36.5 C) (Oral)  Resp 14  Ht 5\' 8"  (1.727 m)  Wt 198 lb (89.812 kg)  BMI 30.11 kg/m2 GEN- NAD, alert and oriented x3 HEENT- PERRL, EOMI, non injected sclera, pink conjunctiva, MMM, oropharynx clear Neck- Supple, no LAD LAD CVS- RRR, no murmur RESP-CTAB ABD-NABS,soft,NT,ND EXT- No edema Pulses- Radial, DP- 2+        Assessment & Plan:      Problem List Items Addressed This Visit   None      Note: This dictation was prepared with Dragon dictation along with smaller phrase technology. Any transcriptional errors that result from this process are unintentional.

## 2013-11-08 NOTE — Patient Instructions (Signed)
Continue current medications F/U in 3 months

## 2013-11-09 NOTE — Assessment & Plan Note (Signed)
Diet controlled , no meds 2/2 weight loss

## 2013-11-09 NOTE — Assessment & Plan Note (Signed)
He has gained a good amount of weight, appetitite much improved, he will hold on the remeron for now as he has been off past few weeks and done well

## 2013-11-09 NOTE — Assessment & Plan Note (Signed)
Well controlled, no change to meds 

## 2013-12-01 ENCOUNTER — Ambulatory Visit (HOSPITAL_BASED_OUTPATIENT_CLINIC_OR_DEPARTMENT_OTHER): Payer: Medicare Other | Admitting: Physician Assistant

## 2013-12-01 ENCOUNTER — Encounter: Payer: Self-pay | Admitting: Physician Assistant

## 2013-12-01 ENCOUNTER — Other Ambulatory Visit (HOSPITAL_BASED_OUTPATIENT_CLINIC_OR_DEPARTMENT_OTHER): Payer: Medicare Other

## 2013-12-01 ENCOUNTER — Telehealth: Payer: Self-pay | Admitting: Oncology

## 2013-12-01 VITALS — BP 141/56 | HR 86 | Temp 97.6°F | Resp 18 | Ht 68.0 in | Wt 190.3 lb

## 2013-12-01 DIAGNOSIS — C7952 Secondary malignant neoplasm of bone marrow: Secondary | ICD-10-CM

## 2013-12-01 DIAGNOSIS — C7951 Secondary malignant neoplasm of bone: Secondary | ICD-10-CM

## 2013-12-01 DIAGNOSIS — C61 Malignant neoplasm of prostate: Secondary | ICD-10-CM

## 2013-12-01 DIAGNOSIS — D649 Anemia, unspecified: Secondary | ICD-10-CM

## 2013-12-01 LAB — COMPREHENSIVE METABOLIC PANEL (CC13)
ALT: 10 U/L (ref 0–55)
ANION GAP: 10 meq/L (ref 3–11)
AST: 21 U/L (ref 5–34)
Albumin: 3.5 g/dL (ref 3.5–5.0)
Alkaline Phosphatase: 249 U/L — ABNORMAL HIGH (ref 40–150)
BILIRUBIN TOTAL: 0.54 mg/dL (ref 0.20–1.20)
BUN: 12.6 mg/dL (ref 7.0–26.0)
CO2: 21 meq/L — AB (ref 22–29)
Calcium: 9.7 mg/dL (ref 8.4–10.4)
Chloride: 111 mEq/L — ABNORMAL HIGH (ref 98–109)
Creatinine: 1.1 mg/dL (ref 0.7–1.3)
GLUCOSE: 124 mg/dL (ref 70–140)
Potassium: 3.8 mEq/L (ref 3.5–5.1)
Sodium: 142 mEq/L (ref 136–145)
TOTAL PROTEIN: 7.1 g/dL (ref 6.4–8.3)

## 2013-12-01 LAB — CBC WITH DIFFERENTIAL/PLATELET
BASO%: 0.5 % (ref 0.0–2.0)
Basophils Absolute: 0 10*3/uL (ref 0.0–0.1)
EOS ABS: 0.3 10*3/uL (ref 0.0–0.5)
EOS%: 4.8 % (ref 0.0–7.0)
HEMATOCRIT: 32.5 % — AB (ref 38.4–49.9)
HEMOGLOBIN: 10.7 g/dL — AB (ref 13.0–17.1)
LYMPH%: 35.3 % (ref 14.0–49.0)
MCH: 29.2 pg (ref 27.2–33.4)
MCHC: 32.8 g/dL (ref 32.0–36.0)
MCV: 89.2 fL (ref 79.3–98.0)
MONO#: 0.6 10*3/uL (ref 0.1–0.9)
MONO%: 9.6 % (ref 0.0–14.0)
NEUT%: 49.8 % (ref 39.0–75.0)
NEUTROS ABS: 2.9 10*3/uL (ref 1.5–6.5)
PLATELETS: 196 10*3/uL (ref 140–400)
RBC: 3.65 10*6/uL — ABNORMAL LOW (ref 4.20–5.82)
RDW: 14.6 % (ref 11.0–14.6)
WBC: 5.8 10*3/uL (ref 4.0–10.3)
lymph#: 2 10*3/uL (ref 0.9–3.3)

## 2013-12-01 NOTE — Progress Notes (Signed)
Hematology and Oncology Follow Up Visit  [name] [id number] Apr 27, 1941 73 y.o. 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 He was initially diagnosed in 2007 with a Gleason score 3+3 equals 6 PSA 4.3 clinical stage TI C.   Prior Therapy: He was treated with external beam radiation utilizing IMRT that was completed in September of 2007. His PSA nadir appears to be around 1.9 in November of 2009. He developed recurrent disease in 2011 with a PSA up to 15.28 and subsequently was found to have bony metastasis including a sclerotic lesion in the chest area. He was treated with androgen deprivation in the form of Degarelix under the care of Dr. Karsten Ro and responded nicely to his bony metastasis including the resolution of a right first rib metastasis. In July of 2014, he developed castration resistant disease with a PSA rise up to 13.89 despite castrate levels testosterone of 15. He was started on Xtandi in October of 2014 with a PSA of 22.89. PSA dropped down in January 2015 to 16.93. Was recently his PSA went up to 39.4 on 08/25/2013 [name] was discontinued due to to report tolerance as well.     Current therapy: Zytiga 1000 mg by mouth daily started in April of 2015.  Interim History: [name] presents today for a followup visit. He is currently being treated with Zytiga and had tolerated it very well. He is accompanied by his wife and daughter today. He had some recent dental work in his gums are still very sore. As a result of this his by mouth intake has been decreased. He reports an occasional constipation but this is well managed with MiraLAX. He is no longer reporting any nausea or vomiting. Has not reported any further weight loss. His appetite has improved dramatically. He has not reported any abdominal distention or GI symptoms. Has not  reported any lower extremity edema. His performance status is improved dramatically and really not reporting any more bone pain.  His performance status and quality of life are stable. He does not report any headaches blurred vision or double vision. He does not report any fevers or chills or sweats. He does not report any chest pain shortness of breath cough or hemoptysis. Does not report any frequency urgency or hematuria. Does not report any skin rashes or lesions. Does not report any lymphadenopathy or bleeding tendencies. His rest of his review of system is unremarkable. He is reporting strict adherence to Zytiga at this time.  Medications: I have reviewed the patient's current medications. Unchanged by my review. Current Outpatient Prescriptions  Medication Sig Dispense Refill  . albuterol (VENTOLIN HFA) 108 (90 BASE) MCG/ACT inhaler Inhale 2 puffs into the lungs every 4 (four) hours as needed for wheezing or shortness of breath.  1 Inhaler  3  . amLODipine (NORVASC) 5 MG tablet Take 1 tablet (5 mg total) by mouth daily.  30 tablet  6  . clopidogrel (PLAVIX) 75 MG tablet Take 75 mg by mouth daily.        Marland Kitchen lisinopril (PRINIVIL,ZESTRIL) 2.5 MG tablet Take 2.5 mg by mouth daily.      . megestrol (MEGACE) 40 MG tablet Take 1 tablet (40 mg total) by mouth 2 (two) times daily.  60 tablet  11  . mirtazapine (REMERON) 7.5 MG tablet Take 1 tablet (7.5 mg total) by mouth at bedtime.  30 tablet  2  . ondansetron (ZOFRAN-ODT) 4 MG disintegrating tablet Take 1 tablet (4 mg total) by mouth every 8 (eight) hours as needed for nausea or vomiting.  20 tablet  2  . pantoprazole (PROTONIX) 40 MG tablet Take 40 mg by mouth daily before breakfast.      . polyethylene glycol powder (GLYCOLAX/MIRALAX) powder Take 17 g by mouth 2 (two) times daily as needed.  3350 g  6  . simvastatin (ZOCOR) 10 MG tablet Take 10 mg by mouth at bedtime.      Marland Kitchen ZYTIGA 250 MG tablet TAKE 4 TABLETS BY MOUTH DAILY ON AN EMPTY STOMACH (1  HOUR BEFORE OR 2 HOURS AFTER A MEAL)  120 tablet  0  . oxyCODONE (OXY IR/ROXICODONE) 5 MG immediate release tablet Take 1 tablet (5 mg total) by mouth every 4 (four) hours as needed for severe pain.  30 tablet  0  . promethazine (PHENERGAN) 25 MG suppository Place 1 suppository (25 mg total) rectally every 6 (six) hours as needed for nausea or vomiting.  12 each  0  . promethazine (PHENERGAN) 25 MG tablet Take 1 tablet (25 mg total) by mouth every 6 (six) hours as needed for nausea or vomiting.  30 tablet  3   No current facility-administered medications for this visit.     Allergies: No Known Allergies  Past Medical History, Surgical history, Social history, and Family History were reviewed and updated.  Physical Exam: Blood pressure 141/56, pulse 86, temperature 97.6 F (36.4 C), temperature source Oral, resp. rate 18, height 5\' 8"  (1.727 m), weight 190 lb 4.8 oz (86.32 kg). ECOG: 1 General appearance: alert and cooperative not in any distress Head: Normocephalic, without obvious abnormality, atraumatic Neck: no adenopathy, no masses or Lymph nodes: Cervical, supraclavicular, and axillary nodes normal. Heart:regular rate and rhythm, S1, S2 normal, no murmur, click, rub or gallop Lung:chest clear, no wheezing, rales, normal symmetric air entry no dullness to percussion Abdomin: soft, non-tender, without masses or organomegaly no shifting dose or ascites.  Neurological examination showed no deficits. EXT:no erythema, induration, or nodules   Lab Results: Lab Results  Component Value Date   WBC 5.8 12/01/2013   HGB 10.7* 12/01/2013   HCT 32.5* 12/01/2013   MCV 89.2 12/01/2013   PLT 196 12/01/2013     Chemistry      Component Value Date/Time   NA 142 12/01/2013 1354   NA 136 09/07/2013 1118   K 3.8 12/01/2013 1354   K 3.9 09/07/2013 1118   CL 106 09/07/2013 1118   CO2 21* 12/01/2013 1354   CO2 24 09/07/2013 1118   BUN 12.6 12/01/2013 1354   BUN 13 09/07/2013 1118   CREATININE 1.1 12/01/2013 1354    CREATININE 1.23 09/07/2013 1118   CREATININE 1.18 08/29/2013 2346      Component Value Date/Time   CALCIUM 9.7 12/01/2013 1354   CALCIUM 9.5 09/07/2013 1118   ALKPHOS 249* 12/01/2013 1354   ALKPHOS 92 09/07/2013 1118   AST 21 12/01/2013 1354   AST 13 09/07/2013 1118   ALT 10 12/01/2013 1354   ALT <8 09/07/2013 1118   BILITOT 0.54 12/01/2013 1354   BILITOT 0.4 09/07/2013 1118        Impression and Plan:   73 year old gentleman with the following issues:  1. 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 His initial diagnosis was in 2007 with a Gleason score 3+3 equals 6. His most recent PSA was up to 39.47 with a doubling time of  less than 3 months. He is currently on Zytiga and tolerating it very well without any complications. His PSA is currently pending from today but he had an excellent clinical response We are continuing this medication for the time being.  2. Bone directed therapy: He was probably benefit from that in the near future. He is status post dental surgery.We can certainly evaluate that for him in the future.  3. Anemia: Likely related to malignancy We will continue to monitor closely.  4. Androgen depravation: This is to be continued under the care of Dr. Karsten Ro.  Awilda Metro E, PA-C  7/1/20152:54 PM

## 2013-12-01 NOTE — Telephone Encounter (Signed)
, °

## 2013-12-02 LAB — PSA: PSA: 191.6 ng/mL — AB (ref <=4.00)

## 2013-12-03 NOTE — Patient Instructions (Signed)
Continue Zytiga at the current dose Follow up in 1 month

## 2013-12-08 ENCOUNTER — Other Ambulatory Visit: Payer: Self-pay | Admitting: Oncology

## 2013-12-28 ENCOUNTER — Telehealth: Payer: Self-pay | Admitting: Oncology

## 2013-12-28 NOTE — Telephone Encounter (Signed)
moved 8/6 appt to AM due to call - s/w dtr she is aware.

## 2013-12-29 ENCOUNTER — Other Ambulatory Visit: Payer: Medicare Other

## 2013-12-29 ENCOUNTER — Ambulatory Visit: Payer: Medicare Other | Admitting: Oncology

## 2014-01-04 ENCOUNTER — Other Ambulatory Visit: Payer: Self-pay | Admitting: Family Medicine

## 2014-01-04 NOTE — Telephone Encounter (Signed)
Refill appropriate and filled per protocol. 

## 2014-01-06 ENCOUNTER — Other Ambulatory Visit (HOSPITAL_BASED_OUTPATIENT_CLINIC_OR_DEPARTMENT_OTHER): Payer: Medicare Other

## 2014-01-06 ENCOUNTER — Telehealth: Payer: Self-pay | Admitting: Oncology

## 2014-01-06 ENCOUNTER — Ambulatory Visit (HOSPITAL_BASED_OUTPATIENT_CLINIC_OR_DEPARTMENT_OTHER): Payer: Medicare Other | Admitting: Oncology

## 2014-01-06 VITALS — BP 141/55 | HR 97 | Temp 97.4°F | Resp 17 | Ht 68.0 in | Wt 183.2 lb

## 2014-01-06 DIAGNOSIS — C7952 Secondary malignant neoplasm of bone marrow: Secondary | ICD-10-CM

## 2014-01-06 DIAGNOSIS — C61 Malignant neoplasm of prostate: Secondary | ICD-10-CM

## 2014-01-06 DIAGNOSIS — D649 Anemia, unspecified: Secondary | ICD-10-CM

## 2014-01-06 DIAGNOSIS — E291 Testicular hypofunction: Secondary | ICD-10-CM

## 2014-01-06 DIAGNOSIS — C7951 Secondary malignant neoplasm of bone: Secondary | ICD-10-CM

## 2014-01-06 LAB — TECHNOLOGIST REVIEW

## 2014-01-06 LAB — CBC WITH DIFFERENTIAL/PLATELET
BASO%: 0.5 % (ref 0.0–2.0)
Basophils Absolute: 0 10*3/uL (ref 0.0–0.1)
EOS%: 4.2 % (ref 0.0–7.0)
Eosinophils Absolute: 0.2 10*3/uL (ref 0.0–0.5)
HCT: 31.7 % — ABNORMAL LOW (ref 38.4–49.9)
HGB: 10.5 g/dL — ABNORMAL LOW (ref 13.0–17.1)
LYMPH#: 2.4 10*3/uL (ref 0.9–3.3)
LYMPH%: 43.6 % (ref 14.0–49.0)
MCH: 28.4 pg (ref 27.2–33.4)
MCHC: 33.1 g/dL (ref 32.0–36.0)
MCV: 85.9 fL (ref 79.3–98.0)
MONO#: 0.4 10*3/uL (ref 0.1–0.9)
MONO%: 7.5 % (ref 0.0–14.0)
NEUT#: 2.4 10*3/uL (ref 1.5–6.5)
NEUT%: 44.2 % (ref 39.0–75.0)
Platelets: 145 10*3/uL (ref 140–400)
RBC: 3.69 10*6/uL — AB (ref 4.20–5.82)
RDW: 15.5 % — AB (ref 11.0–14.6)
WBC: 5.4 10*3/uL (ref 4.0–10.3)

## 2014-01-06 LAB — COMPREHENSIVE METABOLIC PANEL (CC13)
ALBUMIN: 3.5 g/dL (ref 3.5–5.0)
ALT: 16 U/L (ref 0–55)
AST: 44 U/L — AB (ref 5–34)
Alkaline Phosphatase: 310 U/L — ABNORMAL HIGH (ref 40–150)
Anion Gap: 10 mEq/L (ref 3–11)
BUN: 13.9 mg/dL (ref 7.0–26.0)
CALCIUM: 11.6 mg/dL — AB (ref 8.4–10.4)
CHLORIDE: 106 meq/L (ref 98–109)
CO2: 23 mEq/L (ref 22–29)
Creatinine: 1.2 mg/dL (ref 0.7–1.3)
Glucose: 94 mg/dl (ref 70–140)
POTASSIUM: 4 meq/L (ref 3.5–5.1)
SODIUM: 139 meq/L (ref 136–145)
Total Bilirubin: 0.72 mg/dL (ref 0.20–1.20)
Total Protein: 7.2 g/dL (ref 6.4–8.3)

## 2014-01-06 NOTE — Telephone Encounter (Signed)
gv and printed appt schedand avs for pt for Sept °

## 2014-01-06 NOTE — Progress Notes (Signed)
Hematology and Oncology Follow Up Visit  [name] [id number] 1940-07-26 73 y.o. 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 He was initially diagnosed in 2007 with a Gleason score 3+3 equals 6 PSA 4.3 clinical stage TIc.   Prior Therapy: He was treated with external beam radiation utilizing IMRT that was completed in September of 2007. His PSA nadir appears to be around 1.9 in November of 2009. He developed recurrent disease in 2011 with a PSA up to 15.28 and subsequently was found to have bony metastasis including a sclerotic lesion in the chest area. He was treated with androgen deprivation in the form of Degarelix under the care of Dr. Karsten Ro and responded nicely to his bony metastasis including the resolution of a right first rib metastasis. In July of 2014, he developed castration resistant disease with a PSA rise up to 13.89 despite castrate levels testosterone of 15. He was started on Xtandi in October of 2014 with a PSA of 22.89. PSA dropped down in January 2015 to 16.93. Was recently his PSA went up to 39.4 on 08/25/2013 [name] was discontinued due to to report tolerance as well.   Current therapy: Zytiga 1000 mg by mouth daily started in April of 2015.  Interim History: [name] presents today for a followup visit accompanied by his daughter today. Since his last visit, he continues to do well. He tolerated Zytiga without issues. He continues to have poor appetite and lost weight but for the most part he has reasonable quality of life. He reports an occasional constipation but this is well managed with MiraLAX. He is no longer reporting any nausea or vomiting. His appetite has improved with Megace but still at times needs improvement. He has not reported any abdominal distention or GI symptoms. Has not reported any lower extremity edema. His  performance status is improved dramatically and really not reporting any more bone pain. He does not report any headaches blurred vision or double vision. He does not report any fevers or chills or sweats. He does not report any chest pain shortness of breath cough or hemoptysis. Does not report any frequency urgency or hematuria. Does not report any skin rashes or lesions. Does not report any lymphadenopathy or bleeding tendencies. His rest of his review of system is unremarkable.   Medications: I have reviewed the patient's current medications. Unchanged by my review. Current Outpatient Prescriptions  Medication Sig Dispense Refill  . albuterol (VENTOLIN HFA) 108 (90 BASE) MCG/ACT inhaler Inhale 2 puffs into the lungs every 4 (four) hours as needed for wheezing or shortness of breath.  1 Inhaler  3  . amLODipine (NORVASC) 5 MG tablet Take 1 tablet (5 mg total) by mouth daily.  30 tablet  6  . clopidogrel (PLAVIX) 75 MG tablet Take 75 mg by mouth daily.        Marland Kitchen lisinopril (PRINIVIL,ZESTRIL) 2.5 MG tablet Take 2.5 mg by mouth daily.      . megestrol (MEGACE) 40 MG tablet TAKE (1) TABLET TWICE DAILY.  60 tablet  3  . mirtazapine (REMERON) 7.5 MG tablet Take 1 tablet (7.5 mg total) by mouth at bedtime.  30 tablet  2  . ondansetron (ZOFRAN-ODT) 4 MG disintegrating tablet Take 1 tablet (4 mg total) by mouth every 8 (eight) hours as needed for nausea or vomiting.  20 tablet  2  . oxyCODONE (OXY IR/ROXICODONE)  5 MG immediate release tablet Take 1 tablet (5 mg total) by mouth every 4 (four) hours as needed for severe pain.  30 tablet  0  . pantoprazole (PROTONIX) 40 MG tablet Take 40 mg by mouth daily before breakfast.      . polyethylene glycol powder (GLYCOLAX/MIRALAX) powder Take 17 g by mouth 2 (two) times daily as needed.  3350 g  6  . promethazine (PHENERGAN) 25 MG suppository Place 1 suppository (25 mg total) rectally every 6 (six) hours as needed for nausea or vomiting.  12 each  0  . promethazine  (PHENERGAN) 25 MG tablet Take 1 tablet (25 mg total) by mouth every 6 (six) hours as needed for nausea or vomiting.  30 tablet  3  . simvastatin (ZOCOR) 10 MG tablet Take 10 mg by mouth at bedtime.      Marland Kitchen ZYTIGA 250 MG tablet TAKE 4 TABLETS BY MOUTH DAILY ON AN EMPTY STOMACH (1 HOUR BEFORE OR 2 HOURS AFTER A MEAL)  120 tablet  0   No current facility-administered medications for this visit.     Allergies: No Known Allergies  Past Medical History, Surgical history, Social history, and Family History were reviewed and updated.  Physical Exam: Blood pressure 141/55, pulse 97, temperature 97.4 F (36.3 C), temperature source Oral, resp. rate 17, height 5\' 8"  (1.727 m), weight 183 lb 3.2 oz (83.099 kg), SpO2 98.00%. ECOG: 1 General appearance: alert and cooperative not in any distress Head: Normocephalic, without obvious abnormality, atraumatic Neck: no adenopathy, no masses. Lymph nodes: Cervical, supraclavicular, and axillary nodes normal. Heart:regular rate and rhythm, S1, S2 normal, no murmur, click, rub or gallop Lung:chest clear, no wheezing, rales, normal symmetric air entry no dullness to percussion Abdomin: soft, non-tender, without masses or organomegaly no shifting dullness or ascites.  Neurological examination showed no deficits. EXT:no erythema, induration, or nodules   Lab Results: Lab Results  Component Value Date   WBC 5.4 01/06/2014   HGB 10.5* 01/06/2014   HCT 31.7* 01/06/2014   MCV 85.9 01/06/2014   PLT 145 01/06/2014     Chemistry      Component Value Date/Time   NA 142 12/01/2013 1354   NA 136 09/07/2013 1118   K 3.8 12/01/2013 1354   K 3.9 09/07/2013 1118   CL 106 09/07/2013 1118   CO2 21* 12/01/2013 1354   CO2 24 09/07/2013 1118   BUN 12.6 12/01/2013 1354   BUN 13 09/07/2013 1118   CREATININE 1.1 12/01/2013 1354   CREATININE 1.23 09/07/2013 1118   CREATININE 1.18 08/29/2013 2346      Component Value Date/Time   CALCIUM 9.7 12/01/2013 1354   CALCIUM 9.5 09/07/2013 1118   ALKPHOS  249* 12/01/2013 1354   ALKPHOS 92 09/07/2013 1118   AST 21 12/01/2013 1354   AST 13 09/07/2013 1118   ALT 10 12/01/2013 1354   ALT <8 09/07/2013 1118   BILITOT 0.54 12/01/2013 1354   BILITOT 0.4 09/07/2013 1118      Results for DAWSYN, RAMSARAN (MRN [id number]) as of 01/06/2014 11:15  Ref. Range 08/25/2013 11:01 12/01/2013 13:54  PSA Latest Range: <=4.00 ng/mL 39.47 (H) 191.60 (H)    Impression and Plan:   73 year old gentleman with the following issues:  1. 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 His initial diagnosis was in 2007 with a Gleason score 3+3 equals 6. His t PSA was up to 39.47 with a doubling time of less than 3 months. He is currently on  Zytiga and tolerating it very well without any complications. His PSA increased up to 191.6 with the last visit. He is benefiting clinically from Zytiga at this time and I would like to give it 1 more month. If his PSA continues to rise rapidly, we will discontinue it and discuss other options. Systemic chemotherapy will be the next option at that time. If his PSA stabilizes and will continue Zytiga.  2. Bone directed therapy: He was probably benefit from that in the near future. We will evaluate that for him in the future.  3. Anemia: Likely related to malignancy We will continue to monitor closely.  4. Androgen depravation: This is to be continued under the care of Dr. Karsten Ro.  [facility], MD 8/6/201511:36 AM

## 2014-01-07 LAB — PSA: PSA: 590.4 ng/mL — AB (ref <=4.00)

## 2014-01-12 ENCOUNTER — Telehealth: Payer: Self-pay | Admitting: Family Medicine

## 2014-01-12 MED ORDER — MIRTAZAPINE 7.5 MG PO TABS: 7.5000 mg | ORAL_TABLET | Freq: Every day | ORAL | Status: DC

## 2014-01-12 NOTE — Telephone Encounter (Signed)
Prescription sent to pharmacy.   Call placed to patient wife, [name]. [place].

## 2014-01-12 NOTE — Telephone Encounter (Signed)
[phone] PT is needing a refill on  mirtazapine (REMERON) 7.5 MG tablet  -pt wife has a question about the medication as well     [place], [place]

## 2014-01-14 NOTE — Telephone Encounter (Signed)
Call placed to patient wife.   Requested to know what medication is used for.   Advised that Remeron is used for mood stabilization, appetite stimulation and sleep aide.

## 2014-01-17 ENCOUNTER — Telehealth: Payer: Self-pay | Admitting: Family Medicine

## 2014-01-17 DIAGNOSIS — C7952 Secondary malignant neoplasm of bone marrow: Secondary | ICD-10-CM

## 2014-01-17 DIAGNOSIS — Z7401 Bed confinement status: Secondary | ICD-10-CM

## 2014-01-17 DIAGNOSIS — E119 Type 2 diabetes mellitus without complications: Secondary | ICD-10-CM | POA: Diagnosis present

## 2014-01-17 DIAGNOSIS — R112 Nausea with vomiting, unspecified: Secondary | ICD-10-CM | POA: Diagnosis present

## 2014-01-17 DIAGNOSIS — K219 Gastro-esophageal reflux disease without esophagitis: Secondary | ICD-10-CM | POA: Diagnosis present

## 2014-01-17 DIAGNOSIS — Z7902 Long term (current) use of antithrombotics/antiplatelets: Secondary | ICD-10-CM

## 2014-01-17 DIAGNOSIS — C7951 Secondary malignant neoplasm of bone: Secondary | ICD-10-CM | POA: Diagnosis present

## 2014-01-17 DIAGNOSIS — I4460 Unspecified fascicular block: Secondary | ICD-10-CM | POA: Diagnosis present

## 2014-01-17 DIAGNOSIS — J9600 Acute respiratory failure, unspecified whether with hypoxia or hypercapnia: Secondary | ICD-10-CM | POA: Diagnosis not present

## 2014-01-17 DIAGNOSIS — Z79899 Other long term (current) drug therapy: Secondary | ICD-10-CM

## 2014-01-17 DIAGNOSIS — Z8673 Personal history of transient ischemic attack (TIA), and cerebral infarction without residual deficits: Secondary | ICD-10-CM

## 2014-01-17 DIAGNOSIS — Z87891 Personal history of nicotine dependence: Secondary | ICD-10-CM

## 2014-01-17 DIAGNOSIS — J9819 Other pulmonary collapse: Secondary | ICD-10-CM | POA: Diagnosis present

## 2014-01-17 DIAGNOSIS — C61 Malignant neoplasm of prostate: Secondary | ICD-10-CM | POA: Diagnosis present

## 2014-01-17 DIAGNOSIS — E43 Unspecified severe protein-calorie malnutrition: Secondary | ICD-10-CM | POA: Diagnosis present

## 2014-01-17 DIAGNOSIS — T451X5A Adverse effect of antineoplastic and immunosuppressive drugs, initial encounter: Secondary | ICD-10-CM | POA: Diagnosis present

## 2014-01-17 DIAGNOSIS — N179 Acute kidney failure, unspecified: Secondary | ICD-10-CM | POA: Diagnosis present

## 2014-01-17 DIAGNOSIS — I469 Cardiac arrest, cause unspecified: Secondary | ICD-10-CM | POA: Diagnosis not present

## 2014-01-17 DIAGNOSIS — D6182 Myelophthisis: Secondary | ICD-10-CM | POA: Diagnosis present

## 2014-01-17 DIAGNOSIS — I10 Essential (primary) hypertension: Secondary | ICD-10-CM | POA: Diagnosis present

## 2014-01-17 DIAGNOSIS — R5381 Other malaise: Secondary | ICD-10-CM | POA: Diagnosis present

## 2014-01-17 DIAGNOSIS — D61810 Antineoplastic chemotherapy induced pancytopenia: Secondary | ICD-10-CM | POA: Diagnosis present

## 2014-01-17 DIAGNOSIS — G9341 Metabolic encephalopathy: Secondary | ICD-10-CM | POA: Diagnosis present

## 2014-01-17 DIAGNOSIS — Z6826 Body mass index (BMI) 26.0-26.9, adult: Secondary | ICD-10-CM

## 2014-01-17 DIAGNOSIS — Z8249 Family history of ischemic heart disease and other diseases of the circulatory system: Secondary | ICD-10-CM

## 2014-01-17 DIAGNOSIS — R5383 Other fatigue: Secondary | ICD-10-CM

## 2014-01-17 DIAGNOSIS — E785 Hyperlipidemia, unspecified: Secondary | ICD-10-CM | POA: Diagnosis present

## 2014-01-17 NOTE — Telephone Encounter (Signed)
Call placed to patient wife, [name].   Questions about appetite medications megace and remeron answered.

## 2014-01-17 NOTE — Telephone Encounter (Signed)
PATIENTS WIFE IS CALLING FOR HUSBAND REGARDING QUESTIONS ABOUT MEDICATION, BUT NOT SURE OF WHAT MEDICATION PLEASE CALL LINDA BACK AT [phone]

## 2014-01-18 ENCOUNTER — Emergency Department (HOSPITAL_COMMUNITY): Payer: Medicare Other

## 2014-01-18 ENCOUNTER — Encounter (HOSPITAL_COMMUNITY): Payer: Self-pay | Admitting: Emergency Medicine

## 2014-01-18 ENCOUNTER — Other Ambulatory Visit (HOSPITAL_COMMUNITY): Payer: Self-pay | Admitting: Oncology

## 2014-01-18 ENCOUNTER — Other Ambulatory Visit: Payer: Self-pay | Admitting: Family Medicine

## 2014-01-18 ENCOUNTER — Inpatient Hospital Stay (HOSPITAL_COMMUNITY)
Admission: EM | Admit: 2014-01-18 | Discharge: 2014-02-01 | DRG: 640 | Disposition: E | Payer: Medicare Other | Attending: Internal Medicine | Admitting: Internal Medicine

## 2014-01-18 DIAGNOSIS — D6182 Myelophthisis: Secondary | ICD-10-CM | POA: Diagnosis present

## 2014-01-18 DIAGNOSIS — R5381 Other malaise: Secondary | ICD-10-CM | POA: Diagnosis present

## 2014-01-18 DIAGNOSIS — J9600 Acute respiratory failure, unspecified whether with hypoxia or hypercapnia: Secondary | ICD-10-CM | POA: Diagnosis not present

## 2014-01-18 DIAGNOSIS — R112 Nausea with vomiting, unspecified: Secondary | ICD-10-CM | POA: Diagnosis present

## 2014-01-18 DIAGNOSIS — T451X5A Adverse effect of antineoplastic and immunosuppressive drugs, initial encounter: Secondary | ICD-10-CM | POA: Diagnosis present

## 2014-01-18 DIAGNOSIS — C7952 Secondary malignant neoplasm of bone marrow: Secondary | ICD-10-CM

## 2014-01-18 DIAGNOSIS — G9341 Metabolic encephalopathy: Secondary | ICD-10-CM | POA: Diagnosis present

## 2014-01-18 DIAGNOSIS — E119 Type 2 diabetes mellitus without complications: Secondary | ICD-10-CM | POA: Diagnosis present

## 2014-01-18 DIAGNOSIS — D696 Thrombocytopenia, unspecified: Secondary | ICD-10-CM | POA: Diagnosis present

## 2014-01-18 DIAGNOSIS — J9819 Other pulmonary collapse: Secondary | ICD-10-CM | POA: Diagnosis present

## 2014-01-18 DIAGNOSIS — C7951 Secondary malignant neoplasm of bone: Secondary | ICD-10-CM

## 2014-01-18 DIAGNOSIS — I10 Essential (primary) hypertension: Secondary | ICD-10-CM | POA: Diagnosis present

## 2014-01-18 DIAGNOSIS — D630 Anemia in neoplastic disease: Secondary | ICD-10-CM | POA: Diagnosis present

## 2014-01-18 DIAGNOSIS — Z6826 Body mass index (BMI) 26.0-26.9, adult: Secondary | ICD-10-CM | POA: Diagnosis not present

## 2014-01-18 DIAGNOSIS — I4460 Unspecified fascicular block: Secondary | ICD-10-CM | POA: Diagnosis present

## 2014-01-18 DIAGNOSIS — C61 Malignant neoplasm of prostate: Secondary | ICD-10-CM | POA: Diagnosis present

## 2014-01-18 DIAGNOSIS — Z7401 Bed confinement status: Secondary | ICD-10-CM | POA: Diagnosis not present

## 2014-01-18 DIAGNOSIS — Z79899 Other long term (current) drug therapy: Secondary | ICD-10-CM | POA: Diagnosis not present

## 2014-01-18 DIAGNOSIS — E785 Hyperlipidemia, unspecified: Secondary | ICD-10-CM | POA: Diagnosis present

## 2014-01-18 DIAGNOSIS — R531 Weakness: Secondary | ICD-10-CM | POA: Diagnosis present

## 2014-01-18 DIAGNOSIS — D61818 Other pancytopenia: Secondary | ICD-10-CM

## 2014-01-18 DIAGNOSIS — N179 Acute kidney failure, unspecified: Secondary | ICD-10-CM

## 2014-01-18 DIAGNOSIS — Z87891 Personal history of nicotine dependence: Secondary | ICD-10-CM | POA: Diagnosis not present

## 2014-01-18 DIAGNOSIS — Z8249 Family history of ischemic heart disease and other diseases of the circulatory system: Secondary | ICD-10-CM | POA: Diagnosis not present

## 2014-01-18 DIAGNOSIS — Z8673 Personal history of transient ischemic attack (TIA), and cerebral infarction without residual deficits: Secondary | ICD-10-CM | POA: Diagnosis not present

## 2014-01-18 DIAGNOSIS — K219 Gastro-esophageal reflux disease without esophagitis: Secondary | ICD-10-CM | POA: Diagnosis present

## 2014-01-18 DIAGNOSIS — I469 Cardiac arrest, cause unspecified: Secondary | ICD-10-CM | POA: Diagnosis not present

## 2014-01-18 DIAGNOSIS — Z7902 Long term (current) use of antithrombotics/antiplatelets: Secondary | ICD-10-CM | POA: Diagnosis not present

## 2014-01-18 DIAGNOSIS — E43 Unspecified severe protein-calorie malnutrition: Secondary | ICD-10-CM | POA: Diagnosis present

## 2014-01-18 LAB — COMPREHENSIVE METABOLIC PANEL
ALBUMIN: 3 g/dL — AB (ref 3.5–5.2)
ALK PHOS: 260 U/L — AB (ref 39–117)
ALT: 21 U/L (ref 0–53)
ALT: 20 U/L (ref 0–53)
ANION GAP: 16 — AB (ref 5–15)
AST: 63 U/L — ABNORMAL HIGH (ref 0–37)
AST: 59 U/L — AB (ref 0–37)
Albumin: 3.2 g/dL — ABNORMAL LOW (ref 3.5–5.2)
Alkaline Phosphatase: 233 U/L — ABNORMAL HIGH (ref 39–117)
Anion gap: 12 (ref 5–15)
BILIRUBIN TOTAL: 1 mg/dL (ref 0.3–1.2)
BUN: 30 mg/dL — AB (ref 6–23)
BUN: 30 mg/dL — ABNORMAL HIGH (ref 6–23)
CHLORIDE: 104 meq/L (ref 96–112)
CO2: 21 mEq/L (ref 19–32)
CO2: 24 mEq/L (ref 19–32)
CREATININE: 2.06 mg/dL — AB (ref 0.50–1.35)
Calcium: >15 mg/dL (ref 8.4–10.5)
Chloride: 108 mEq/L (ref 96–112)
Creatinine, Ser: 2.02 mg/dL — ABNORMAL HIGH (ref 0.50–1.35)
GFR calc Af Amer: 35 mL/min — ABNORMAL LOW (ref >90)
GFR calc Af Amer: 36 mL/min — ABNORMAL LOW (ref >90)
GFR calc non Af Amer: 30 mL/min — ABNORMAL LOW (ref >90)
GFR calc non Af Amer: 31 mL/min — ABNORMAL LOW (ref >90)
GLUCOSE: 124 mg/dL — AB (ref 70–99)
Glucose, Bld: 113 mg/dL — ABNORMAL HIGH (ref 70–99)
POTASSIUM: 3.7 meq/L (ref 3.7–5.3)
Potassium: 3.6 mEq/L — ABNORMAL LOW (ref 3.7–5.3)
SODIUM: 144 meq/L (ref 137–147)
Sodium: 141 mEq/L (ref 137–147)
Total Bilirubin: 1.5 mg/dL — ABNORMAL HIGH (ref 0.3–1.2)
Total Protein: 7.2 g/dL (ref 6.0–8.3)
Total Protein: 6.4 g/dL (ref 6.0–8.3)

## 2014-01-18 LAB — CBC WITH DIFFERENTIAL/PLATELET
BASOS PCT: 0 % (ref 0–1)
Basophils Absolute: 0 10*3/uL (ref 0.0–0.1)
EOS ABS: 0.1 10*3/uL (ref 0.0–0.7)
EOS PCT: 2 % (ref 0–5)
HCT: 25.7 % — ABNORMAL LOW (ref 39.0–52.0)
Hemoglobin: 8.9 g/dL — ABNORMAL LOW (ref 13.0–17.0)
LYMPHS ABS: 1.9 10*3/uL (ref 0.7–4.0)
Lymphocytes Relative: 34 % (ref 12–46)
MCH: 29 pg (ref 26.0–34.0)
MCHC: 34.6 g/dL (ref 30.0–36.0)
MCV: 83.7 fL (ref 78.0–100.0)
Monocytes Absolute: 0.6 10*3/uL (ref 0.1–1.0)
Monocytes Relative: 10 % (ref 3–12)
Neutro Abs: 3 10*3/uL (ref 1.7–7.7)
Neutrophils Relative %: 54 % (ref 43–77)
PLATELETS: 82 10*3/uL — AB (ref 150–400)
RBC: 3.07 MIL/uL — AB (ref 4.22–5.81)
RDW: 14.7 % (ref 11.5–15.5)
Smear Review: DECREASED
WBC: 5.6 10*3/uL (ref 4.0–10.5)

## 2014-01-18 LAB — CBC
HEMATOCRIT: 23.1 % — AB (ref 39.0–52.0)
Hemoglobin: 7.8 g/dL — ABNORMAL LOW (ref 13.0–17.0)
MCH: 28.4 pg (ref 26.0–34.0)
MCHC: 33.8 g/dL (ref 30.0–36.0)
MCV: 84 fL (ref 78.0–100.0)
PLATELETS: 74 10*3/uL — AB (ref 150–400)
RBC: 2.75 MIL/uL — AB (ref 4.22–5.81)
RDW: 14.9 % (ref 11.5–15.5)
WBC: 5.5 10*3/uL (ref 4.0–10.5)

## 2014-01-18 LAB — TYPE AND SCREEN
ABO/RH(D): B POS
ANTIBODY SCREEN: POSITIVE

## 2014-01-18 LAB — IRON AND TIBC
IRON: 81 ug/dL (ref 42–135)
Saturation Ratios: 32 % (ref 20–55)
TIBC: 253 ug/dL (ref 215–435)
UIBC: 172 ug/dL (ref 125–400)

## 2014-01-18 LAB — RETICULOCYTES
RBC.: 2.89 MIL/uL — ABNORMAL LOW (ref 4.22–5.81)
RETIC COUNT ABSOLUTE: 17.3 10*3/uL — AB (ref 19.0–186.0)
Retic Ct Pct: 0.6 % (ref 0.4–3.1)

## 2014-01-18 LAB — PREPARE RBC (CROSSMATCH)

## 2014-01-18 LAB — FOLATE: FOLATE: 17.2 ng/mL

## 2014-01-18 LAB — VITAMIN B12: Vitamin B-12: 272 pg/mL (ref 211–911)

## 2014-01-18 LAB — FERRITIN: Ferritin: 1163 ng/mL — ABNORMAL HIGH (ref 22–322)

## 2014-01-18 MED ORDER — DIPHENHYDRAMINE HCL 25 MG PO CAPS: 25.0000 mg | ORAL_CAPSULE | Freq: Once | ORAL | Status: DC

## 2014-01-18 MED ORDER — MEGESTROL ACETATE 40 MG PO TABS
40.0000 mg | ORAL_TABLET | Freq: Two times a day (BID) | ORAL | Status: DC
  Administered 2014-01-18: 40 mg via ORAL
  Filled 2014-01-18 (×3): qty 1

## 2014-01-18 MED ORDER — ABIRATERONE ACETATE 250 MG PO TABS: 1000.0000 mg | ORAL_TABLET | Freq: Every day | ORAL | Status: DC

## 2014-01-18 MED ORDER — ALBUTEROL SULFATE (2.5 MG/3ML) 0.083% IN NEBU: 3.0000 mL | INHALATION_SOLUTION | RESPIRATORY_TRACT | Status: DC | PRN

## 2014-01-18 MED ORDER — SODIUM CHLORIDE 0.9 % IV SOLN
90.0000 mg | Freq: Once | INTRAVENOUS | Status: AC
  Administered 2014-01-18: 90 mg via INTRAVENOUS
  Filled 2014-01-18: qty 10

## 2014-01-18 MED ORDER — SODIUM CHLORIDE 0.9 % IV SOLN: 250.0000 mL | Freq: Once | INTRAVENOUS | Status: DC

## 2014-01-18 MED ORDER — POLYETHYLENE GLYCOL 3350 17 GM/SCOOP PO POWD
17.0000 g | Freq: Two times a day (BID) | ORAL | Status: DC | PRN
  Filled 2014-01-18: qty 255

## 2014-01-18 MED ORDER — PANTOPRAZOLE SODIUM 40 MG PO TBEC
40.0000 mg | DELAYED_RELEASE_TABLET | Freq: Every day | ORAL | Status: DC
  Administered 2014-01-18: 40 mg via ORAL
  Filled 2014-01-18: qty 1

## 2014-01-18 MED ORDER — DOCUSATE SODIUM 100 MG PO CAPS
100.0000 mg | ORAL_CAPSULE | Freq: Every day | ORAL | Status: DC | PRN
  Administered 2014-01-18: 100 mg via ORAL
  Filled 2014-01-18: qty 1

## 2014-01-18 MED ORDER — HEPARIN SOD (PORK) LOCK FLUSH 100 UNIT/ML IV SOLN: 500.0000 [IU] | Freq: Every day | INTRAVENOUS | Status: DC | PRN

## 2014-01-18 MED ORDER — MIRTAZAPINE 15 MG PO TABS
7.5000 mg | ORAL_TABLET | Freq: Every day | ORAL | Status: DC
Start: 2014-01-18 — End: 2014-01-19
  Filled 2014-01-18: qty 1

## 2014-01-18 MED ORDER — PROMETHAZINE HCL 12.5 MG PO TABS: 25.0000 mg | ORAL_TABLET | Freq: Four times a day (QID) | ORAL | Status: DC | PRN

## 2014-01-18 MED ORDER — CLOPIDOGREL BISULFATE 75 MG PO TABS
75.0000 mg | ORAL_TABLET | Freq: Every day | ORAL | Status: DC
  Administered 2014-01-18: 75 mg via ORAL
  Filled 2014-01-18: qty 1

## 2014-01-18 MED ORDER — ACETAMINOPHEN 325 MG PO TABS: 650.0000 mg | ORAL_TABLET | Freq: Four times a day (QID) | ORAL | Status: DC | PRN

## 2014-01-18 MED ORDER — PROMETHAZINE HCL 25 MG RE SUPP
25.0000 mg | Freq: Four times a day (QID) | RECTAL | Status: DC | PRN
  Administered 2014-01-18: 25 mg via RECTAL
  Filled 2014-01-18: qty 1

## 2014-01-18 MED ORDER — GLUCERNA SHAKE PO LIQD
237.0000 mL | Freq: Three times a day (TID) | ORAL | Status: DC
  Administered 2014-01-18: 237 mL via ORAL

## 2014-01-18 MED ORDER — SODIUM CHLORIDE 0.9 % IJ SOLN: 10.0000 mL | INTRAMUSCULAR | Status: DC | PRN

## 2014-01-18 MED ORDER — SIMVASTATIN 10 MG PO TABS: 10.0000 mg | ORAL_TABLET | Freq: Every day | ORAL | Status: DC

## 2014-01-18 MED ORDER — ONDANSETRON 4 MG PO TBDP: 4.0000 mg | ORAL_TABLET | Freq: Three times a day (TID) | ORAL | Status: DC | PRN

## 2014-01-18 MED ORDER — ONDANSETRON HCL 4 MG/2ML IJ SOLN
4.0000 mg | Freq: Once | INTRAMUSCULAR | Status: AC
  Administered 2014-01-18: 4 mg via INTRAVENOUS
  Filled 2014-01-18: qty 2

## 2014-01-18 MED ORDER — POLYETHYLENE GLYCOL 3350 17 G PO PACK: 17.0000 g | PACK | Freq: Two times a day (BID) | ORAL | Status: DC | PRN

## 2014-01-18 MED ORDER — ONDANSETRON HCL 4 MG/2ML IJ SOLN
4.0000 mg | Freq: Three times a day (TID) | INTRAMUSCULAR | Status: DC
  Administered 2014-01-18: 4 mg via INTRAVENOUS
  Filled 2014-01-18: qty 2

## 2014-01-18 MED ORDER — AMLODIPINE BESYLATE 5 MG PO TABS
5.0000 mg | ORAL_TABLET | Freq: Every day | ORAL | Status: DC
  Administered 2014-01-18: 5 mg via ORAL
  Filled 2014-01-18: qty 1

## 2014-01-18 MED ORDER — SODIUM CHLORIDE 0.9 % IJ SOLN: 3.0000 mL | INTRAMUSCULAR | Status: DC | PRN

## 2014-01-18 MED ORDER — CALCITONIN (SALMON) 200 UNIT/ML IJ SOLN
320.0000 [IU] | Freq: Two times a day (BID) | INTRAMUSCULAR | Status: DC
  Administered 2014-01-18: 320 [IU] via INTRAMUSCULAR
  Filled 2014-01-18 (×3): qty 1.6

## 2014-01-18 MED ORDER — ENOXAPARIN SODIUM 40 MG/0.4ML ~~LOC~~ SOLN
40.0000 mg | SUBCUTANEOUS | Status: DC
  Administered 2014-01-18: 40 mg via SUBCUTANEOUS
  Filled 2014-01-18: qty 0.4

## 2014-01-18 MED ORDER — ACETAMINOPHEN 325 MG PO TABS: 650.0000 mg | ORAL_TABLET | Freq: Once | ORAL | Status: DC

## 2014-01-18 MED ORDER — ACETAMINOPHEN 650 MG RE SUPP: 650.0000 mg | Freq: Four times a day (QID) | RECTAL | Status: DC | PRN

## 2014-01-18 MED ORDER — OXYCODONE HCL 5 MG PO TABS: 5.0000 mg | ORAL_TABLET | ORAL | Status: DC | PRN

## 2014-01-18 MED ORDER — ALUM & MAG HYDROXIDE-SIMETH 200-200-20 MG/5ML PO SUSP
30.0000 mL | Freq: Four times a day (QID) | ORAL | Status: DC | PRN
Start: 2014-01-18 — End: 2014-01-19

## 2014-01-18 MED ORDER — SODIUM CHLORIDE 0.9 % IV SOLN
INTRAVENOUS | Status: DC
  Administered 2014-01-18 (×2): via INTRAVENOUS

## 2014-01-18 MED ORDER — SODIUM CHLORIDE 0.9 % IV BOLUS (SEPSIS)
1000.0000 mL | Freq: Once | INTRAVENOUS | Status: AC
  Administered 2014-01-18: 1000 mL via INTRAVENOUS

## 2014-01-18 NOTE — Care Management Note (Signed)
    Page 1 of 1   01/09/2014     2:59:25 PM CARE MANAGEMENT NOTE 01/29/2014  Patient:  [name], [name]   Account Number:  [id number]  Date Initiated:  01/24/2014  Documentation initiated by:  Jolene Provost  Subjective/Objective Assessment:   Patient from home, lives with wife, was independent at home prior to admission. Pt had no HH services, DME's or medication needs.     Action/Plan:   PT eval recommends SNF but will further evaluate tomorrow after improvement of electorlye imbalance. Will continue to follow for CM needs.   Anticipated DC Date:  01-24-14   Anticipated DC Plan:  HOME/SELF CARE         Choice offered to / List presented to:             Status of service:  In process, will continue to follow Medicare Important Message given?   (If response is "NO", the following Medicare IM given date fields will be blank) Date Medicare IM given:   Medicare IM given by:   Date Additional Medicare IM given:   Additional Medicare IM given by:    Discharge Disposition:  HOME/SELF CARE  Per UR Regulation:    If discussed at Long Length of Stay Meetings, dates discussed:    Comments:  01/14/2014 [place], RN, MSN, [facility]

## 2014-01-18 NOTE — Discharge Summary (Addendum)
Death Summary  [name] KGU:[id number] DOB: 1940/08/04 DOA: Jan 22, 2014  PCP: [name], MD  Admit date: 01-22-14 Date of Death: 2014-01-22  Final Diagnoses:  Principal Problem:   Hypercalcemia of malignancy Active Problems:   PROSTATE CANCER   HYPERTENSION   N&V (nausea and vomiting)   Acute renal failure   Protein-calorie malnutrition, severe   Anemia in neoplastic disease   Thrombocytopenia, unspecified   Encephalopathy, metabolic   Generalized weakness Atelectasis Incomplete LBBB  History of present illness and hospital course:  This is a 73 year old gentleman who was admitted to [facility] on the morning of 01/22/2014. The patient has known stage IV prostate cancer. He was admitted with progressive weakness, nausea, vomiting, abdominal pain, and confusion/lethargy. On evaluation in the emergency room he was noted to have serum calcium greater than 15 and associated acute renal failure. He was admitted to the hospital for medical management of his hypercalcemia. He received aggressive IV hydration, bisphosphonate and calcitonin therapy. Upon my evaluation earlier in the day, the patient was lying in bed, trying to climb out of bed. He was easily redirected, did not appear in any distress, and was carrying on conversation. He did not appear to have any focal neurologic deficits. He did not appear septic or toxic. Per staff, at approximately 6 PM his wife reported that patient may have had some jerking movements. He became unresponsive and per nurses began to have agonal breathing. When evaluated by nursing staff, he was noted to be pulseless. CODE BLUE was called and CPR was initiated. Once the pads were applied to his chest, cardiac monitor showed asystole. Chest compressions were continued and he received epinephrine, atropine, bicarbonate. Please refer to CODE BLUE sheet for further details. The patient was intubated by Dr. Leonides Schanz, ER physician. Unfortunately, at no  point during resuscitation did the patient regain his pulse or rhythm. He remained in asystole throughout the code. Patient was pronounced dead at 18:41. The family was informed and were provided emotional support.  MEMON,JEHANZEB   Time: 87mins  Signed:  MEMON,JEHANZEB  [facility] 2014/01/22, 8:31 PM

## 2014-01-18 NOTE — Progress Notes (Signed)
CRITICAL VALUE ALERT  Critical value received: calcium >15  Date of notification: 01/11/2014  Time of notification:  0625  Critical value read back:yes  Nurse who received alert:  Eugenie Birks  MD notified (1st page):  Nehemiah Settle  Time of first page: 0630  MD notified (2nd page):n/a  Time of second page:n/a  Responding MD:n/a Pamidronate infusing at this time.    Time MD responded: n/a

## 2014-01-18 NOTE — Progress Notes (Signed)
01/07/2014 2005 Late entry for 1845. Called to patient's room at 1806 by nurse tech stating "he's not breathing right". Pt having agonal breaths, no pulse. Code blue called at 1808. CPR initiated with nursing staff at bedside. Dr. Roderic Palau on unit and notified, came to room to assist. See Code Blue record sheet.Time of death pronounced at [place] per Dr. Roderic Palau. Wife and family members notified by MD and nursing staff. Emotional support provided by nursing staff. Janeece Fitting, RN

## 2014-01-18 NOTE — Telephone Encounter (Signed)
Refill appropriate and filled per protocol. 

## 2014-01-18 NOTE — Progress Notes (Signed)
Patient admitted by Dr. Nehemiah Settle earlier this morning.  Patient seen and examined.  He's been admitted to the hospital with progressive generalized weakness, confusion and abdominal pain. His known history of prostate cancer. He was found to have significantly elevated calcium greater than 15 with associated renal failure. The patient has been admitted for IV hydration, he is receiving bisphosphonate therapy and calcitonin. Oncology has been consulted to assist with management. If his renal failure dose to improve, he may need nephrology input. Continue IV fluids for now.  [name]

## 2014-01-18 NOTE — ED Notes (Signed)
INTUBATION Performed by: Nyra Jabs  Required items: required blood products, implants, devices, and special equipment available Patient identity confirmed: provided demographic data and hospital-assigned identification number Time out: Immediately prior to procedure a "time out" was called to verify the correct patient, procedure, equipment, support staff and site/side marked as required.  Indications: Cardiac arrest, respiratory failure   Intubation method: Glidescope Laryngoscopy   Preoxygenation: BVM  Sedatives: 20 mg IV Etomidate Paralytic: 80 mg IV Succinylcholine  Tube Size: 8.0 cuffed  Post-procedure assessment: chest rise and ETCO2 monitor Breath sounds: equal and absent over the epigastrium Tube secured with: ETT holder  Patient tolerated the procedure well with no immediate complications.     [place], DO 01/20/2014 2232

## 2014-01-18 NOTE — ED Provider Notes (Signed)
CSN: [id number]     Arrival date & time 01/17/14  2343 History  This chart was scribed for Maudry Diego, MD by Lowella Petties, ED Scribe. The patient was seen in room APA07/APA07. Patient's care was started at 12:40 AM.     Chief Complaint  Patient presents with  . Weakness  . Emesis   Patient is a 73 y.o. male presenting with weakness and vomiting. The history is provided by the patient. No language interpreter was used.  Weakness This is a new problem. The current episode started more than 2 days ago. The problem occurs constantly. The problem has been gradually worsening. Pertinent negatives include no chest pain, no abdominal pain, no headaches and no shortness of breath. The symptoms are aggravated by walking and exertion. Nothing relieves the symptoms. He has tried nothing for the symptoms.  Emesis Severity:  Moderate Timing:  Intermittent Able to tolerate:  Solids Progression:  Unchanged Relieved by:  Antiemetics Associated symptoms: no abdominal pain, no arthralgias, no headaches and no sore throat    HPI Comments: [name] is a 73 y.o. male with prostate cancer who presents to the Emergency Department complaining of generalized weakness. His wife reports that he has bone cancer, and states that over the last three days he has become too weak to walk and he has been vomiting intermittently. She reports that he has been unable to keep food down.   PCP: [name], MD Oncologist: Dr. Shawna Clamp and Dr. Quinn Axe  Past Medical History  Diagnosis Date  . Prostate cancer SEP 2011 BONE METS  . CVA (cerebral vascular accident)     off coumadin  . DM (diabetes mellitus)   . HTN (hypertension)   . Diverticulosis 10/09    LGIB  . Schatzki's ring OCT 2009  . Colon polyp OCT 2009  . Mallory - Weiss tear OCT 2009    AFTER VOMITING FROM GO-LYTELY  . Internal hemorrhoid    Past Surgical History  Procedure Laterality Date  . Hemicolectomy  LEFT ?complicated diverticulitis  .  Colonoscopy  2009 2O TO LGIB    NL TI, AC POLYP not removed, TICS  . Upper gastrointestinal endoscopy  2009 2o to hematemesis    limited 2o to LARGE MW TEAR, S/P CLIPS x5   Family History  Problem Relation Age of Onset  . Colon cancer Neg Hx   . Colon polyps Neg Hx   . Heart disease Mother    History  Substance Use Topics  . Smoking status: Former Smoker -- 0.03 packs/day for 3 years    Types: Cigarettes  . Smokeless tobacco: Former Systems developer     Comment: three cigarettas a day  . Alcohol Use: No    Review of Systems  Constitutional: Negative for fever.  HENT: Negative for congestion and sore throat.   Eyes: Negative.   Respiratory: Negative for chest tightness and shortness of breath.   Cardiovascular: Negative for chest pain.  Gastrointestinal: Positive for nausea and vomiting. Negative for abdominal pain.  Genitourinary: Negative.   Musculoskeletal: Negative for arthralgias, joint swelling and neck pain.  Skin: Negative.  Negative for rash and wound.  Neurological: Positive for weakness. Negative for dizziness, light-headedness, numbness and headaches.  Psychiatric/Behavioral: Negative.    Allergies  Review of patient's allergies indicates no known allergies.  Home Medications   Prior to Admission medications   Medication Sig Start Date End Date Taking? Authorizing Provider  albuterol (VENTOLIN HFA) 108 (90 BASE) MCG/ACT inhaler Inhale 2  puffs into the lungs every 4 (four) hours as needed for wheezing or shortness of breath. 05/03/13   Alycia Rossetti, MD  amLODipine (NORVASC) 5 MG tablet Take 1 tablet (5 mg total) by mouth daily. 08/13/13   Alycia Rossetti, MD  clopidogrel (PLAVIX) 75 MG tablet Take 75 mg by mouth daily.      Historical Provider, MD  lisinopril (PRINIVIL,ZESTRIL) 2.5 MG tablet Take 2.5 mg by mouth daily.    Historical Provider, MD  megestrol (MEGACE) 40 MG tablet TAKE (1) TABLET TWICE DAILY. 01/04/14   Alycia Rossetti, MD  mirtazapine (REMERON) 7.5 MG  tablet Take 1 tablet (7.5 mg total) by mouth at bedtime. 01/12/14   Alycia Rossetti, MD  ondansetron (ZOFRAN-ODT) 4 MG disintegrating tablet Take 1 tablet (4 mg total) by mouth every 8 (eight) hours as needed for nausea or vomiting. 09/07/13   Alycia Rossetti, MD  oxyCODONE (OXY IR/ROXICODONE) 5 MG immediate release tablet Take 1 tablet (5 mg total) by mouth every 4 (four) hours as needed for severe pain. 10/19/13   Wyatt Portela, MD  pantoprazole (PROTONIX) 40 MG tablet Take 40 mg by mouth daily before breakfast.    Historical Provider, MD  polyethylene glycol powder (GLYCOLAX/MIRALAX) powder Take 17 g by mouth 2 (two) times daily as needed. 09/07/13   Alycia Rossetti, MD  promethazine (PHENERGAN) 25 MG suppository Place 1 suppository (25 mg total) rectally every 6 (six) hours as needed for nausea or vomiting. 08/21/13   Sharyon Cable, MD  promethazine (PHENERGAN) 25 MG tablet Take 1 tablet (25 mg total) by mouth every 6 (six) hours as needed for nausea or vomiting. 09/07/13   Alycia Rossetti, MD  simvastatin (ZOCOR) 10 MG tablet Take 10 mg by mouth at bedtime.    Historical Provider, MD  ZYTIGA 250 MG tablet TAKE 4 TABLETS BY MOUTH DAILY ON AN EMPTY STOMACH (1 HOUR BEFORE OR 2 HOURS AFTER A MEAL) 12/08/13   Wyatt Portela, MD   Triage Vitals: BP 106/44  Pulse 108  Temp(Src) 97.6 F (36.4 C) (Oral)  Resp 22  Ht 5\' 9"  (1.753 m)  Wt 180 lb (81.647 kg)  BMI 26.57 kg/m2  SpO2 97% Physical Exam  Nursing note and vitals reviewed. Constitutional: He is oriented to person, place, and time. He appears well-developed.  Generalized weakness.  HENT:  Head: Normocephalic.  Dry mucus membranes.  Eyes: Conjunctivae and EOM are normal. No scleral icterus.  Neck: Neck supple. No thyromegaly present.  Cardiovascular: Normal rate and regular rhythm.  Exam reveals no gallop and no friction rub.   No murmur heard. Pulmonary/Chest: No stridor. He has no wheezes. He has no rales. He exhibits tenderness (left  sided).  Abdominal: He exhibits no distension. There is no tenderness. There is no rebound.  Musculoskeletal: Normal range of motion. He exhibits no edema.  Lymphadenopathy:    He has no cervical adenopathy.  Neurological: He is oriented to person, place, and time. He exhibits normal muscle tone. Coordination normal.  Skin: No rash noted. No erythema.  Psychiatric: He has a normal mood and affect. His behavior is normal.    ED Course  Procedures (including critical care time) DIAGNOSTIC STUDIES: Oxygen Saturation is 97% on room air, normal by my interpretation.    COORDINATION OF CARE: 12:45 AM-Discussed treatment plan which includes UA and labs with pt at bedside and pt agreed to plan.   Labs Review Labs Reviewed - No data to display  Imaging Review No results found.   EKG Interpretation None      MDM   Final diagnoses:  None    Hypercalcemia.  admit  Maudry Diego, MD 01/17/2014 [phone]

## 2014-01-18 NOTE — ED Notes (Signed)
CRITICAL VALUE ALERT  Critical value received:  Ca+  Date of notification:  01/22/2014  Time of notification:  0200  Critical value read back:Yes.    Nurse who received alert:  Brendia Sacks  MD notified (1st page):  Dr Roderic Palau  Time of first page:  0200  MD notified (2nd page):  Time of second page:  Responding MD:  Roderic Palau  Time MD responded:  0200

## 2014-01-18 NOTE — Progress Notes (Addendum)
INITIAL NUTRITION ASSESSMENT  DOCUMENTATION CODES Per approved criteria  -Severe malnutrition in the context of chronic illness   Pt meets criteria for severe MALNUTRITION in the context of chronic illness as evidenced by <75% of estimated energy intake x 1 month, severe fat and muscle depletion, 10% wt loss x 3 months.  INTERVENTION: Glucerna Shake po TID, each supplement provides 220 kcal and 10 grams of protein  NUTRITION DIAGNOSIS: Inadequate oral intake related to decreased appetite as evidenced by severe fat and muscle depletion, 10% wt loss x 3 months.   Goal: Pt will meet >90% of estimated nutritional needs  Monitor:  PO intake, labs, weight changes, I/O's  Reason for Assessment: MST  73 y.o. male  Admitting Dx: Hypercalcemia of malignancy  [name] is a 73 y.o. 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 He is currently undergoing chemotherapy and hormone therapy. He presents to the emergency room and with increasing weakness over the past week. He has been unable to walk and get out of bed. Additionally his wife notes that he has been complaining of leg pain and abdominal pain over the same timeframe. His wife also notes some mild confusion over the past couple of nights. These symptoms have increased over the past week. No palliating or provoking factors. His wife finally brought him to the hospital for evaluation.  ASSESSMENT: Pt admitted for hypercalcemia of malignancy. He has prostate cancer with metastasis to the bone, followed by [facility]. He is currently on chemotherapy.  Pt drowsy with flat affect at time of visit. He would only answer short ended questions during interview. He confirms wt loss and poor appetite but unable to quantify amount or time frame in which this occurred. He denies dysphagia, but confirms he has no desire to eat. He reports he ate minimal breakfast this AM. Noted cereal and  milk at bedside were untouched. He has been taking megace and remeron PTA due to poor appetite and weight loss.  Documented wt hx reveals progressive wt loss over the past year, including10% x 3 months. Labs reviewed. K: 3.6, BUN/Creat: 30/2.2, Glucose: 113. Latst Hgb A1c of 5.8 reveals impaired glucose tolerance. Noted pt was previously on oral agents, but there were d/c due to weight loss and improved Hgb A1c.   Nutrition Focused Physical Exam:  Subcutaneous Fat:  Orbital Region: severe depletion Upper Arm Region: WDL Thoracic and Lumbar Region: WDL  Muscle:  Temple Region: severe depletion Clavicle Bone Region: severe depletion Clavicle and Acromion Bone Region: mild depletion Scapular Bone Region: mild depletion Dorsal Hand: WDL Patellar Region: severe depletion Anterior Thigh Region: moderate depletion Posterior Calf Region: moderate depletion  Edema: none present  Height: Ht Readings from Last 1 Encounters:  01/17/2014 5\' 9"  (1.753 m)    Weight: Wt Readings from Last 1 Encounters:  01/21/2014 177 lb 11.1 oz (80.6 kg)    Ideal Body Weight: 160#  % Ideal Body Weight: 111%  Wt Readings from Last 10 Encounters:  01/30/2014 177 lb 11.1 oz (80.6 kg)  01/06/14 183 lb 3.2 oz (83.099 kg)  12/01/13 190 lb 4.8 oz (86.32 kg)  11/08/13 198 lb (89.812 kg)  10/27/13 197 lb 1.6 oz (89.404 kg)  09/28/13 187 lb 12.8 oz (85.186 kg)  09/07/13 203 lb (92.08 kg)  08/29/13 204 lb 9 oz (92.789 kg)  08/25/13 204 lb 9.6 oz (92.806 kg)  08/20/13 218 lb (98.884 kg)   Usual Body Weight: 230#  %  Usual Body Weight: 87%  BMI:  Body mass index is 26.23 kg/(m^2). Overweight  Estimated Nutritional Needs: Kcal: 2418-2821 Protein: 101-121 grams Fluid: 2.4-2.8 L  Skin: WDL  Diet Order: Carb Control  EDUCATION NEEDS: -Education not appropriate at this time   Intake/Output Summary (Last 24 hours) at 01/15/2014 0852 Last data filed at 01/31/2014 0215  Gross per 24 hour  Intake   1000 ml   Output      0 ml  Net   1000 ml    Last BM: PTA  Labs:   Recent Labs Lab 01/17/2014 0048 01/03/2014 0517  NA 141 144  K 3.7 3.6*  CL 104 108  CO2 21 24  BUN 30* 30*  CREATININE 2.06* 2.02*  CALCIUM >15.0* >15.0*  GLUCOSE 124* 113*    CBG (last 3)  No results found for this basename: GLUCAP,  in the last 72 hours  Lab Results  Component Value Date   HGBA1C 5.8* 09/07/2013   Scheduled Meds: . abiraterone Acetate  1,000 mg Oral Daily  . amLODipine  5 mg Oral Daily  . calcitonin  320 Units Intramuscular BID  . clopidogrel  75 mg Oral Daily  . enoxaparin (LOVENOX) injection  40 mg Subcutaneous Q24H  . megestrol  40 mg Oral BID  . mirtazapine  7.5 mg Oral QHS  . pantoprazole  40 mg Oral QAC breakfast  . simvastatin  10 mg Oral QHS    Continuous Infusions: . sodium chloride 200 mL/hr at 01/07/2014 8182    Past Medical History  Diagnosis Date  . Prostate cancer SEP 2011 BONE METS  . CVA (cerebral vascular accident)     off coumadin  . DM (diabetes mellitus)   . HTN (hypertension)   . Diverticulosis 10/09    LGIB  . Schatzki's ring OCT 2009  . Colon polyp OCT 2009  . Mallory - Weiss tear OCT 2009    AFTER VOMITING FROM GO-LYTELY  . Internal hemorrhoid     Past Surgical History  Procedure Laterality Date  . Hemicolectomy  LEFT ?complicated diverticulitis  . Colonoscopy  2009 2O TO LGIB    NL TI, AC POLYP not removed, TICS  . Upper gastrointestinal endoscopy  2009 2o to hematemesis    limited 2o to LARGE MW TEAR, S/P CLIPS x5    [name] A. Jimmye Norman, RD, LDN Pager: [phone]

## 2014-01-18 NOTE — Care Management Utilization Note (Signed)
UR completed 

## 2014-01-18 NOTE — Progress Notes (Signed)
Called ME to verify if patient would be a potential case.  ME denied patient as a potential case.

## 2014-01-18 NOTE — H&P (Signed)
History and Physical  KRISTY CATOE UYQ:[id number] DOB: 1940/12/11 DOA: 01/07/2014  Referring physician: Emergency department PCP: [name], MD   Chief Complaint: Weakness  HPI: Walter CZAJA is a 73 y.o. 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  He is currently undergoing chemotherapy and hormone therapy. He presents to the emergency room and with increasing weakness over the past week. He has been unable to walk and get out of bed. Additionally his wife notes that he has been complaining of leg pain and abdominal pain over the same timeframe. His wife also notes some mild confusion over the past couple of nights. These symptoms have increased over the past week. No palliating or provoking factors. His wife finally brought him to the hospital for evaluation.   Review of Systems:  Pt complains of weakness, leg pain, mouth pain due to recent teeth extraction, abdominal pain.  Pt denies any fevers, chills, chest pain, shortness of breath, wheezing, palpitations.  Review of systems are otherwise negative  Past Medical History  Diagnosis Date  . Prostate cancer SEP 2011 BONE METS  . CVA (cerebral vascular accident)     off coumadin  . DM (diabetes mellitus)   . HTN (hypertension)   . Diverticulosis 10/09    LGIB  . Schatzki's ring OCT 2009  . Colon polyp OCT 2009  . Mallory - Weiss tear OCT 2009    AFTER VOMITING FROM GO-LYTELY  . Internal hemorrhoid    Past Surgical History  Procedure Laterality Date  . Hemicolectomy  LEFT ?complicated diverticulitis  . Colonoscopy  2009 2O TO LGIB    NL TI, AC POLYP not removed, TICS  . Upper gastrointestinal endoscopy  2009 2o to hematemesis    limited 2o to LARGE MW TEAR, S/P CLIPS x5   Social History:  reports that he has quit smoking. His smoking use included Cigarettes. He has a .09 pack-year smoking history. He has quit using smokeless tobacco. He reports that  he does not drink alcohol or use illicit drugs. Patient lives at home & is able to participate in activities of daily living with assistance. Prior to a week ago, he was ambulating and driving.  No Known Allergies  Family History  Problem Relation Age of Onset  . Colon cancer Neg Hx   . Colon polyps Neg Hx   . Heart disease Mother       Prior to Admission medications   Medication Sig Start Date End Date Taking? Authorizing Provider  albuterol (VENTOLIN HFA) 108 (90 BASE) MCG/ACT inhaler Inhale 2 puffs into the lungs every 4 (four) hours as needed for wheezing or shortness of breath. 05/03/13   Alycia Rossetti, MD  amLODipine (NORVASC) 5 MG tablet Take 1 tablet (5 mg total) by mouth daily. 08/13/13   Alycia Rossetti, MD  clopidogrel (PLAVIX) 75 MG tablet Take 75 mg by mouth daily.      Historical Provider, MD  lisinopril (PRINIVIL,ZESTRIL) 2.5 MG tablet Take 2.5 mg by mouth daily.    Historical Provider, MD  megestrol (MEGACE) 40 MG tablet TAKE (1) TABLET TWICE DAILY. 01/04/14   Alycia Rossetti, MD  mirtazapine (REMERON) 7.5 MG tablet Take 1 tablet (7.5 mg total) by mouth at bedtime. 01/12/14   Alycia Rossetti, MD  ondansetron (ZOFRAN-ODT) 4 MG disintegrating tablet Take 1 tablet (4 mg total) by mouth every 8 (eight) hours as needed for nausea or vomiting. 09/07/13   Modena Nunnery  Opdyke, MD  oxyCODONE (OXY IR/ROXICODONE) 5 MG immediate release tablet Take 1 tablet (5 mg total) by mouth every 4 (four) hours as needed for severe pain. 10/19/13   Wyatt Portela, MD  pantoprazole (PROTONIX) 40 MG tablet Take 40 mg by mouth daily before breakfast.    Historical Provider, MD  polyethylene glycol powder (GLYCOLAX/MIRALAX) powder Take 17 g by mouth 2 (two) times daily as needed. 09/07/13   Alycia Rossetti, MD  promethazine (PHENERGAN) 25 MG suppository Place 1 suppository (25 mg total) rectally every 6 (six) hours as needed for nausea or vomiting. 08/21/13   Sharyon Cable, MD  promethazine (PHENERGAN) 25  MG tablet Take 1 tablet (25 mg total) by mouth every 6 (six) hours as needed for nausea or vomiting. 09/07/13   Alycia Rossetti, MD  simvastatin (ZOCOR) 10 MG tablet Take 10 mg by mouth at bedtime.    Historical Provider, MD  ZYTIGA 250 MG tablet TAKE 4 TABLETS BY MOUTH DAILY ON AN EMPTY STOMACH (1 HOUR BEFORE OR 2 HOURS AFTER A MEAL) 12/08/13   Wyatt Portela, MD    Physical Exam: BP 122/60  Pulse 102  Temp(Src) 97.6 F (36.4 C) (Oral)  Resp 22  Ht 5\' 9"  (1.753 m)  Wt 81.647 kg (180 lb)  BMI 26.57 kg/m2  SpO2 93%  General:  Gen. this is a 73 year old black male who is awake alert and oriented x3. He is not in acute cardiopulmonary distress. Eyes: Pupils equal, round, reactive to light. Extraocular muscles are intact. Sclerae anicteric and noninjected. ENT: External auditory canals are patent and tympanic membranes reflect a good cone of light. Oropharynx is edentulous with recent marks from extraction. There are no other mucosal lesions. Mucosal membranes are moist. Neck: Supple and without lymphadenopathy. No masses appreciated. No carotid bruits. Cardiovascular: Regular rate, normal S1-S2 sounds. No murmurs auscultated. Respiratory: Clear to auscultation bilaterally with no wheezes, rales, rhonchi. Good respiratory effort Abdomen: Soft, nontender, nondistended. No masses or splenomegaly palpated. There is no guarding. Skin: Warm to touch with 2+ dorsalis pedis and radial pulses. No rashes, bruises, petechiae. Musculoskeletal: Mild tenderness to palpation to the legs bilaterally. Major joints are nontender. Psychiatric: Appropriate insight and judgment. Neurologic: Awake and oriented x3. Answers questions appropriately. No major focal neurological deficit observed.           Labs on Admission:  Basic Metabolic Panel:  Recent Labs Lab 01/23/2014 0048  NA 141  K 3.7  CL 104  CO2 21  GLUCOSE 124*  BUN 30*  CREATININE 2.06*  CALCIUM >15.0*   Liver Function Tests:  Recent  Labs Lab 01/24/2014 0048  AST 63*  ALT 21  ALKPHOS 260*  BILITOT 1.0  PROT 7.2  ALBUMIN 3.2*   No results found for this basename: LIPASE, AMYLASE,  in the last 168 hours No results found for this basename: AMMONIA,  in the last 168 hours CBC:  Recent Labs Lab 01/08/2014 0048  WBC 5.6  NEUTROABS 3.0  HGB 8.9*  HCT 25.7*  MCV 83.7  PLT 82*   Cardiac Enzymes: No results found for this basename: CKTOTAL, CKMB, CKMBINDEX, TROPONINI,  in the last 168 hours  BNP (last 3 results) No results found for this basename: PROBNP,  in the last 8760 hours CBG: No results found for this basename: GLUCAP,  in the last 168 hours  Radiological Exams on Admission: Dg Chest Portable 1 View  01/02/2014   CLINICAL DATA:  Weakness and emesis  EXAM:  PORTABLE CHEST - 1 VIEW  COMPARISON:  08/15/2013  FINDINGS: Low volume lungs with increased density of the lower chest. No cardiomegaly given technique. Negative upper mediastinal contours. No definite effusion. No pneumothorax.  IMPRESSION: Basilar lung opacities favor atelectasis in the setting low lung volumes. Cannot exclude aspiration or infectious infiltrate.   Electronically Signed   By: Jorje Guild M.D.   On: 01/23/2014 01:08    Assessment/Plan Present on Admission:  . Hypercalcemia of malignancy . Acute renal failure  #1 hypercalcemia malignancy Admit Start IV fluids at 200 mils per hour Calcitonin IM Bisphosphonate IV Recheck serum calcium in the morning  #2 acute renal failure Continue with IVs. Hold lisinopril  #3 pancytopenia Secondary to chemotherapy Consults oncology in the morning  #4 Hypertension continue amlodipine  #5 prostate cancer Chemotherapy and hormone therapy  Consultants: Oncology in the morning  Code Status: Full  Family Communication: Wife   Disposition Plan: Return home after correction of medical problems  Time spent: 80 minutes  Loma Boston, DO [facility] Pager  [phone]  **Disclaimer: This note may have been dictated with voice recognition software. Similar sounding words can inadvertently be transcribed and this note may contain transcription errors which may not have been corrected upon publication of note.**

## 2014-01-18 NOTE — ED Notes (Signed)
Wife reports generalized weakness since Friday, states patient has history of bone CA. Also reports patient has been vomiting intermittently since Thursday. States worsening symptoms tonight. Patient complains of constant nausea.

## 2014-01-18 NOTE — Progress Notes (Signed)
Dr.Stinson would like for calcitonin to be given as soon as pharmacy brings medicine up. Will pass on to oncoming nurse.

## 2014-01-18 NOTE — Consult Note (Signed)
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 Alen Blew Lovett Calender) with a past medical history significant for DM, HTN, hyperlipidemia, history of stroke, GERD, and protein-calorie malnutrition who presented to the [facility] ED on 01/02/2014 with complaints of weakness and vomiting.  During the patient's ED work-up he was noted to be severely hypercalcemic with a calcium level > 15 and thrombocytopenic.  In hindsight, he was noted to show signs of hypercalcemia on 01/06/2014 when his calcium was 11.6 at [facility] in [place].  I do not see any evidence that this was addressed at that time.  Due to his hypercalcemia, he was admitted to the [facility] and oncology was consulted.  Chart is reviewed.  Of note, the patient was seen by his treating oncologist, Dr. Alen Blew on 01/06/2014.  His impression/plan regarding his prostate cancer is as follows: "Castration resistant metastatic prostate cancer to the bone. His initial diagnosis was in 2007 with a Gleason score 3+3 equals 6. His t PSA was up to 39.47 with a doubling time of less than 3 months. He is currently on Zytiga and tolerating it very well without any complications. His PSA increased up to 191.6 with the last visit. He is benefiting clinically from Zytiga at this time and I would like to give it 1 more month. If his PSA continues to rise rapidly, we will discontinue it and  discuss other options. Systemic chemotherapy will be the next option at that time. If his PSA stabilizes and will continue Zytiga."  I personally reviewed and went over laboratory results with the patient.  The results are noted within this dictation.  I personally reviewed and went over radiographic studies with the patient.  The results are noted within this dictation.   Chest xray demonstrates the following:Basilar lung opacities favor atelectasis in the setting low lung  volumes. Cannot exclude aspiration or infectious infiltrate.  It is noted that the patient is on Zytiga without prednisone due to the patient's diabetes.  I recommend that this regimen be re-addressed as an outpatient since Prednisone is indicated for this treatment regimen and his diabetes can be treated to offset the potential side effects of glucocorticosteroid.  Oncologically, he denies any complaints and ROS questioning is negative.  PAST MEDICAL HISTORY:   Past Medical History  Diagnosis Date  . Prostate cancer SEP 2011 BONE METS  . CVA (cerebral vascular accident)     off coumadin  . DM (diabetes mellitus)   . HTN (hypertension)   . Diverticulosis 10/09    LGIB  . Schatzki's ring OCT 2009  . Colon polyp OCT 2009  . Mallory - Weiss tear OCT 2009    AFTER VOMITING FROM [place]  . Internal hemorrhoid     ALLERGIES: No Known Allergies    MEDICATIONS: I have reviewed the patient's current medications.     PAST SURGICAL HISTORY Past Surgical History  Procedure Laterality Date  . Hemicolectomy  LEFT ?complicated diverticulitis  . Colonoscopy  2009 2O TO LGIB    NL TI, AC POLYP not removed, TICS  . Upper gastrointestinal endoscopy  2009 2o to hematemesis    limited 2o to LARGE MW TEAR, S/P CLIPS x5    FAMILY HISTORY: Family History  Problem Relation Age of Onset  . Colon cancer Neg Hx   . Colon polyps Neg Hx   . Heart disease Mother     SOCIAL HISTORY:  reports that he has quit smoking. His  smoking use included Cigarettes. He has a .09 pack-year smoking history. He has quit using smokeless tobacco. He reports that he does not drink alcohol or use illicit drugs.  PERFORMANCE STATUS: The patient's performance status is 3 - Symptomatic, >50% confined to bed  PHYSICAL EXAM: Most Recent Vital Signs: Blood pressure 134/47, pulse 100, temperature 98.2 F (36.8 C), temperature source Oral, resp. rate 19, height $RemoveBe'5\' 9"'HdUMEZWgQ$  (1.753 m), weight 177 lb 11.1 oz (80.6 kg), SpO2 95.00%. General appearance: appears stated age, no distress and slowed mentation Head: Normocephalic, without obvious abnormality, atraumatic Eyes: negative findings: lids and lashes normal, conjunctivae and sclerae normal and corneas clear Neck: supple, symmetrical, trachea midline Lungs: clear to auscultation bilaterally Heart: regular rate and rhythm Abdomen: normal findings: no masses palpable and soft, non-tender Skin: Skin color, texture, turgor normal. No rashes or lesions Neurologic: Mental status: alertness: lethargic, orientation: none, affect: blunted  LABORATORY DATA:  Results for orders placed during the hospital encounter of 01/23/2014 (from the past 48 hour(s))  CBC WITH DIFFERENTIAL     Status: Abnormal   Collection Time    01/20/2014 12:48 AM      Result Value Ref Range   WBC 5.6  4.0 - 10.5 K/uL   Comment: RESULT REPEATED AND VERIFIED   RBC 3.07 (*) 4.22 - 5.81 MIL/uL   Hemoglobin 8.9 (*) 13.0 - 17.0 g/dL   HCT 25.7 (*) 39.0 - 52.0 %   MCV 83.7  78.0 - 100.0 fL   MCH 29.0  26.0 - 34.0 pg   MCHC 34.6  30.0 - 36.0 g/dL   RDW 14.7  11.5 - 15.5 %   Platelets 82 (*) 150 - 400 K/uL   Comment: RESULT REPEATED AND VERIFIED     SPECIMEN CHECKED FOR CLOTS   Neutrophils Relative % 54  43 - 77 %   Lymphocytes Relative 34  12 - 46 %   Monocytes Relative 10  3 - 12 %   Eosinophils Relative 2  0 - 5 %   Basophils Relative 0  0 - 1 %   Neutro Abs 3.0  1.7 - 7.7 K/uL   Lymphs Abs 1.9  0.7 - 4.0 K/uL    Monocytes Absolute 0.6  0.1 - 1.0 K/uL   Eosinophils Absolute 0.1  0.0 - 0.7 K/uL   Basophils Absolute 0.0  0.0 - 0.1 K/uL   RBC Morphology ELLIPTOCYTES     WBC Morphology ATYPICAL LYMPHOCYTES     Comment: MILD LEFT SHIFT (1-5% METAS, OCC MYELO, OCC BANDS)   Smear Review PLATELETS APPEAR DECREASED    COMPREHENSIVE METABOLIC PANEL     Status: Abnormal   Collection Time    01/28/2014 12:48 AM      Result Value Ref Range   Sodium 141  137 - 147 mEq/L   Potassium 3.7  3.7 - 5.3 mEq/L   Chloride 104  96 - 112 mEq/L   CO2 21  19 - 32 mEq/L  Glucose, Bld 124 (*) 70 - 99 mg/dL   BUN 30 (*) 6 - 23 mg/dL   Creatinine, Ser 2.06 (*) 0.50 - 1.35 mg/dL   Calcium >15.0 (*) 8.4 - 10.5 mg/dL   Comment: RESULT REPEATED AND VERIFIED     CRITICAL RESULT CALLED TO, READ BACK BY AND VERIFIED WITH:     HILTON R AT 0155 ON [id number] BY FORSYTH K   Total Protein 7.2  6.0 - 8.3 g/dL   Albumin 3.2 (*) 3.5 - 5.2 g/dL   AST 63 (*) 0 - 37 U/L   ALT 21  0 - 53 U/L   Alkaline Phosphatase 260 (*) 39 - 117 U/L   Total Bilirubin 1.0  0.3 - 1.2 mg/dL   GFR calc non Af Amer 30 (*) >90 mL/min   GFR calc Af Amer 35 (*) >90 mL/min   Comment: (NOTE)     The eGFR has been calculated using the CKD EPI equation.     This calculation has not been validated in all clinical situations.     eGFR's persistently <90 mL/min signify possible Chronic Kidney     Disease.   Anion gap 16 (*) 5 - 15  COMPREHENSIVE METABOLIC PANEL     Status: Abnormal   Collection Time    01/30/2014  5:17 AM      Result Value Ref Range   Sodium 144  137 - 147 mEq/L   Potassium 3.6 (*) 3.7 - 5.3 mEq/L   Chloride 108  96 - 112 mEq/L   CO2 24  19 - 32 mEq/L   Glucose, Bld 113 (*) 70 - 99 mg/dL   BUN 30 (*) 6 - 23 mg/dL   Creatinine, Ser 2.02 (*) 0.50 - 1.35 mg/dL   Calcium >15.0 (*) 8.4 - 10.5 mg/dL   Comment: CRITICAL RESULT CALLED TO, READ BACK BY AND VERIFIED WITH:     MARTIN,C AT 6:25AM ON 01/28/2014 BY FESTERMAN,C   Total Protein 6.4  6.0 - 8.3  g/dL   Albumin 3.0 (*) 3.5 - 5.2 g/dL   AST 59 (*) 0 - 37 U/L   ALT 20  0 - 53 U/L   Alkaline Phosphatase 233 (*) 39 - 117 U/L   Total Bilirubin 1.5 (*) 0.3 - 1.2 mg/dL   GFR calc non Af Amer 31 (*) >90 mL/min   GFR calc Af Amer 36 (*) >90 mL/min   Comment: (NOTE)     The eGFR has been calculated using the CKD EPI equation.     This calculation has not been validated in all clinical situations.     eGFR's persistently <90 mL/min signify possible Chronic Kidney     Disease.   Anion gap 12  5 - 15  CBC     Status: Abnormal   Collection Time    01/07/2014  5:17 AM      Result Value Ref Range   WBC 5.5  4.0 - 10.5 K/uL   RBC 2.75 (*) 4.22 - 5.81 MIL/uL   Hemoglobin 7.8 (*) 13.0 - 17.0 g/dL   HCT 23.1 (*) 39.0 - 52.0 %   MCV 84.0  78.0 - 100.0 fL   MCH 28.4  26.0 - 34.0 pg   MCHC 33.8  30.0 - 36.0 g/dL   RDW 14.9  11.5 - 15.5 %   Platelets 74 (*) 150 - 400 K/uL   Comment: SPECIMEN CHECKED FOR CLOTS     CONSISTENT WITH PREVIOUS RESULT  RADIOGRAPHY: Dg Chest Portable 1 View  01/01/2014   CLINICAL DATA:  Weakness and emesis  EXAM: PORTABLE CHEST - 1 VIEW  COMPARISON:  08/15/2013  FINDINGS: Low volume lungs with increased density of the lower chest. No cardiomegaly given technique. Negative upper mediastinal contours. No definite effusion. No pneumothorax.  IMPRESSION: Basilar lung opacities favor atelectasis in the setting low lung volumes. Cannot exclude aspiration or infectious infiltrate.   Electronically Signed   By: Jorje Guild M.D.   On: 01/29/2014 01:08       PATHOLOGY:  Nothing new   ASSESSMENT:  1. Hypercalcemia, secondary to malignancy 2. 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 His initial diagnosis was in 2007 with a Gleason score 3+3 =6. His PSA was up to 39.47 with a doubling time of less than 3 months. He is currently on Zytiga and continued androgen deprivation.  3. Myelophthisis   Patient Active Problem List   Diagnosis Date Noted  .  Hypercalcemia of malignancy 01/21/2014  . Acute renal failure 01/03/2014  . Pancytopenia 01/13/2014  . Protein-calorie malnutrition, severe 01/13/2014  . Gait instability 09/07/2013  . Unspecified protein-calorie malnutrition 09/07/2013  . N&V (nausea and vomiting) 09/07/2013  . Unspecified constipation 09/07/2013  . Environmental allergies 02/13/2012  . GERD (gastroesophageal reflux disease) 02/13/2012  . Hyperlipidemia 11/11/2011  . Obesity 11/11/2011  . PROSTATE CANCER 10/28/2008  . Type II or unspecified type diabetes mellitus without mention of complication, not stated as uncontrolled 10/28/2008  . HYPERTENSION 10/28/2008  . History of stroke 10/28/2008  . DIVERTICULOSIS OF COLON 10/28/2008     PLAN:  1. I personally reviewed and went over laboratory results with the patient.  The results are noted within this dictation. 2. I personally reviewed and went over radiographic studies with the patient.  The results are noted within this dictation.   3. Chart reviewed 4. Labs today: Testerosterone, PSA, anemia panel, epo level. 5. S/P Pamidronate on 8/18.  Will follow Ca++ level re-administer in the future if needed. 6. 2 unit PRBC transfusion today 7. Recommend outpatient re-consideration of the role of Prednisone with Zytiga and stricter DM control if Prednisone is initiated.  A. Coadministration of glucocorticoid with Abiraterone helps offset the adverse events associated with CYP17A1 inhibition with abiraterone.  Prednisone compensates for the abiraterone-induced reductions in serum cortisol and blocks the compensatory increase in adrenocorticotropic hormone. In general, glucocorticoid-related adverse events, including bone loss, immunosuppression, hyperglycemia, mood and cognitive alterations, and myopathy, appear dose related and tend to occur at doses and/or treatment durations greater than the low dose of prednisone approved in combination with abiraterone for the treatment of  mCRPC.  Auchus RJ, et al. Oncologist, 2014 Dec; 19(12): 1231-40. 8. Will follow along as an inpatient. 9. At time of discharge, he is to follow-up with his treating oncologist, Dr. Zola Button.   All questions were answered. The patient knows to call the clinic with any problems, questions or concerns. We can certainly see the patient much sooner if necessary.  Patient and plan discussed with Dr. Farrel Gobble and he is in agreement with the aforementioned.    KEFALAS,THOMAS  01/14/2014

## 2014-01-18 NOTE — Evaluation (Signed)
Physical Therapy Evaluation Patient Details Name: [name] MRN: [id number] DOB: Oct 03, 1940 Today's Date: 01/24/2014   History of Present Illness  [name] is a 73 y.o. 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  He is currently undergoing chemotherapy and hormone therapy. He presents to the emergency room and with increasing weakness over the past week. He has been unable to walk and get out of bed. Additionally his wife notes that he has been complaining of leg pain and abdominal pain over the same timeframe. His wife also notes some mild confusion over the past couple of nights. These symptoms have increased over the past week. No palliating or provoking factors. His wife finally brought him to the hospital for evaluation.  Clinical Impression  Pt is a 73 year old male who presents to physical therapy with dx of hypercalcemia of malignancy.  Pt is a questionable historian, and no family present during evaluation.  During evaluation, pt required min/mod assist for bed mobility skills, min assist for transfers, and min guard and use of RW during gait.  Noted varying step patterning during gait, with alternating step through and step to gait pattern along with varying cadence.  VC throughout assessment for safety with hand placement during bed mobility and transfers, and to attempt to normalize step length and cadence during gait.  Pt reports increased complaints of Lt LE pain limiting gait distance.  Recommend continued PT while in the hospital to address activity tolerance, balance, pain management, and strengthening for improved functional mobility skills.  Recommend continued PT at a SNF depending on pt progress with therapy and cognition for safety, or transition to HHPT if pt demonstrates improvements.  No DME recommendations at this time.      Follow Up Recommendations SNF;Supervision for mobility/OOB    Equipment  Recommendations  None recommended by PT       Precautions / Restrictions Precautions Precautions: Fall Restrictions Weight Bearing Restrictions: No      Mobility  Bed Mobility Overal bed mobility: Needs Assistance Bed Mobility: Supine to Sit;Sit to Supine     Supine to sit: Mod assist (for trunk) Sit to supine: Min assist (for Lt LE (leading LE) into bed)      Transfers Overall transfer level: Needs assistance Equipment used: Rolling walker (2 wheeled) Transfers: Sit to/from Stand Sit to Stand: Min assist            Ambulation/Gait Ambulation/Gait assistance: Min guard Ambulation Distance (Feet): 100 Feet Assistive device: Rolling walker (2 wheeled) Gait Pattern/deviations: Step-to pattern;Step-through pattern;Decreased step length - right;Decreased step length - left   Gait velocity interpretation: Below normal speed for age/gender General Gait Details: Alternating step length with step to/through gait patterning, along with alternating gait speed.       Balance Overall balance assessment: Needs assistance Sitting-balance support: Feet supported;Single extremity supported Sitting balance-Leahy Scale: Good     Standing balance support: Bilateral upper extremity supported;During functional activity Standing balance-Leahy Scale: Fair                               Pertinent Vitals/Pain Pain Assessment: Faces Faces Pain Scale: Hurts little more (during gait) Pain Location: Lt LE Pain Descriptors / Indicators: Throbbing Pain Intervention(s): Repositioned;Limited activity within patient's tolerance (Gait distance limited secondary to complaints of pain in Lt LE)    Home Living Family/patient expects to be discharged to:: Private residence  Living Arrangements: Spouse/significant other Available Help at Discharge: Family;Available 24 hours/day Type of Home: House Home Access: Stairs to enter Entrance Stairs-Rails: Can reach both Entrance  Stairs-Number of Steps: ~6-8 Home Layout: One level Home Equipment: Walker - 2 wheels;Cane - single point Brewing technologist) Additional Comments: Pt is a questionable historian and no family present during PT evaluation.     Prior Function Level of Independence: Independent with assistive device(s);Needs assistance   Gait / Transfers Assistance Needed: Pt reports assist "sometimes" with bed mobility skills and transfers.  Pt reports use of RW or std cane during gait, unable to specifiy which is used more often.                Extremity/Trunk Assessment               Lower Extremity Assessment: Generalized weakness         Communication   Communication: No difficulties  Cognition Arousal/Alertness: Awake/alert Behavior During Therapy: WFL for tasks assessed/performed Overall Cognitive Status: No family/caregiver present to determine baseline cognitive functioning (Pt aware of DOB, location, though unable to correctly answer year)                               Assessment/Plan    PT Assessment Patient needs continued PT services  PT Diagnosis Generalized weakness;Abnormality of gait   PT Problem List Decreased strength;Decreased activity tolerance;Decreased balance;Decreased mobility;Decreased safety awareness  PT Treatment Interventions Gait training;Neuromuscular re-education;Balance training;Functional mobility training;Therapeutic activities;Therapeutic exercise;Patient/family education;Stair training   PT Goals (Current goals can be found in the Care Plan section) Acute Rehab PT Goals Patient Stated Goal: none stated    Frequency Min 3X/week   Barriers to discharge           End of Session Equipment Utilized During Treatment: Gait belt Activity Tolerance: Patient limited by pain;Patient limited by fatigue Patient left: in bed;with call bell/phone within reach;with bed alarm set           Time: 3710-6269 PT Time Calculation (min): 23  min   Charges:   PT Evaluation $Initial PT Evaluation Tier I: 1 Procedure          Gerik Coberly 01/09/2014, 10:21 AM

## 2014-01-19 LAB — TESTOSTERONE: Testosterone: 14 ng/dL — ABNORMAL LOW (ref 300–890)

## 2014-01-19 LAB — TESTOSTERONE, % FREE: Testosterone-% Free: 1.5 % — ABNORMAL LOW (ref 1.6–2.9)

## 2014-01-19 LAB — TESTOSTERONE, FREE: Testosterone, Free: 2.1 pg/mL — ABNORMAL LOW (ref 47.0–244.0)

## 2014-01-19 LAB — PSA: PSA: 783.2 ng/mL — ABNORMAL HIGH (ref <=4.00)

## 2014-01-19 LAB — SEX HORMONE BINDING GLOBULIN: SEX HORMONE BINDING: 45 nmol/L (ref 13–71)

## 2014-01-20 LAB — ERYTHROPOIETIN: Erythropoietin: 46.2 m[IU]/mL — ABNORMAL HIGH (ref 2.6–18.5)

## 2014-01-21 MED FILL — Medication: Qty: 1 | Status: AC

## 2014-02-01 DEATH — deceased

## 2014-02-03 ENCOUNTER — Other Ambulatory Visit: Payer: Medicare Other

## 2014-02-03 ENCOUNTER — Ambulatory Visit: Payer: Medicare Other | Admitting: Oncology

## 2014-02-08 ENCOUNTER — Ambulatory Visit: Payer: Medicare Other | Admitting: Family Medicine

## 2014-06-07 IMAGING — CT CT ABD-PELV W/ CM
2 of 4 series · 16 of 46 positions shown, 18 images · IV contrast (Omnipaque 300)
Comparison: 07/19/2013

CLINICAL DATA: Vomiting.  Abdominal pain.  Prostate carcinoma.

EXAM:
CT ABDOMEN AND PELVIS WITH CONTRAST
TECHNIQUE: Multidetector CT imaging of the abdomen and pelvis was performed
using the standard protocol following bolus administration of
intravenous contrast.
CONTRAST:  100mL OMNIPAQUE IOHEXOL 300 MG/ML  SOLN

[Series 2: abd_pel_with 5.0 b40f · axial · 0.72mm/px · z∈[-450,-16]mm · 13 of 97 slices shown, 15 images]
[im 5/97  soft-tissue]
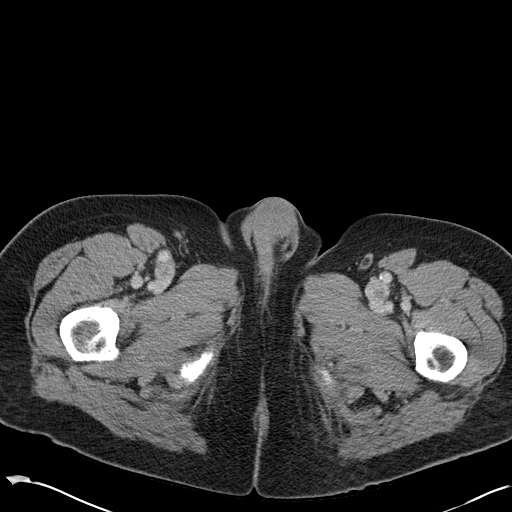
[im 5/97  bone]
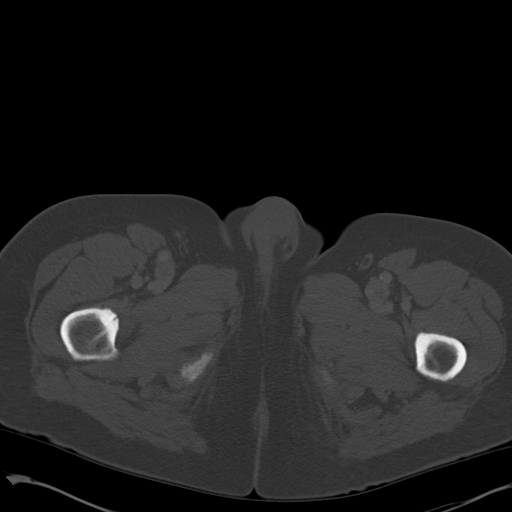
[im 14/97  soft-tissue]
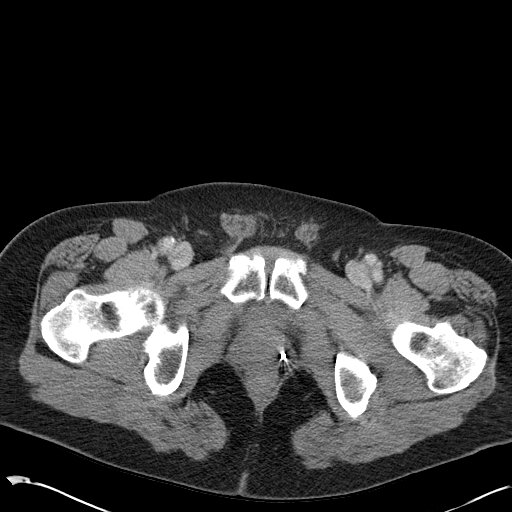
[im 19/97  soft-tissue]
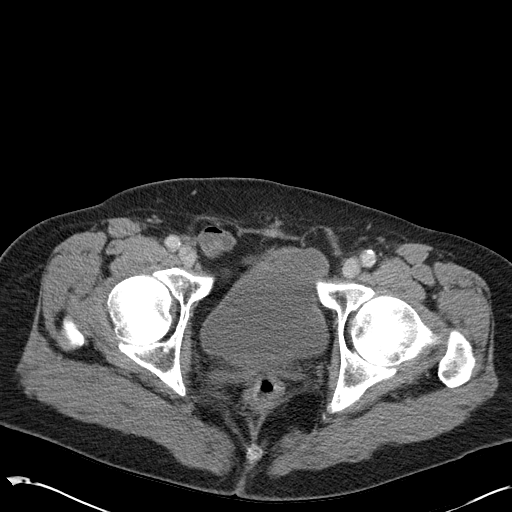
[im 28/97  soft-tissue]
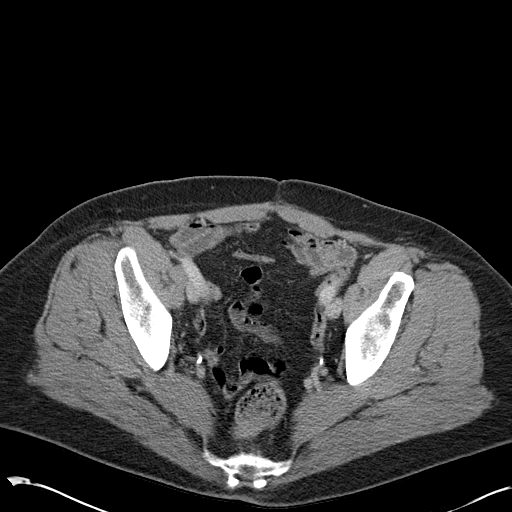
[im 33/97  soft-tissue]
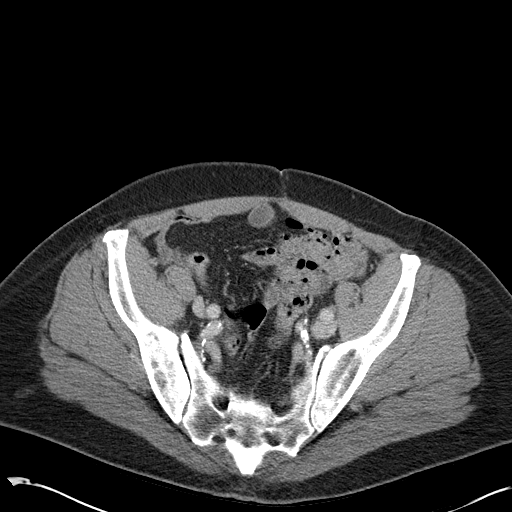
[im 42/97  soft-tissue]
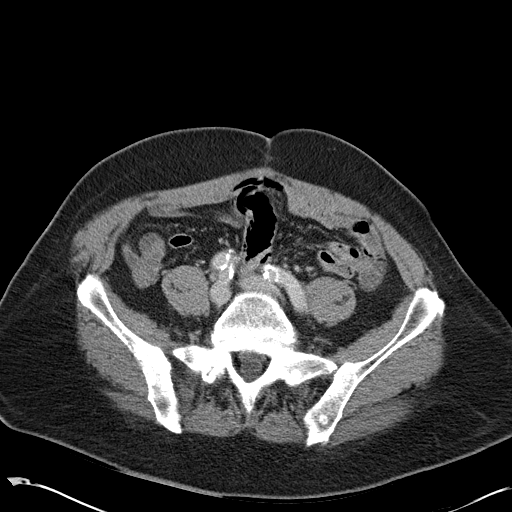
[im 51/97  soft-tissue]
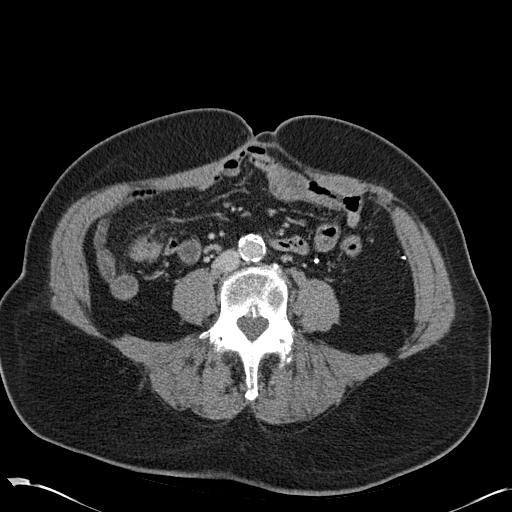
[im 55/97  soft-tissue]
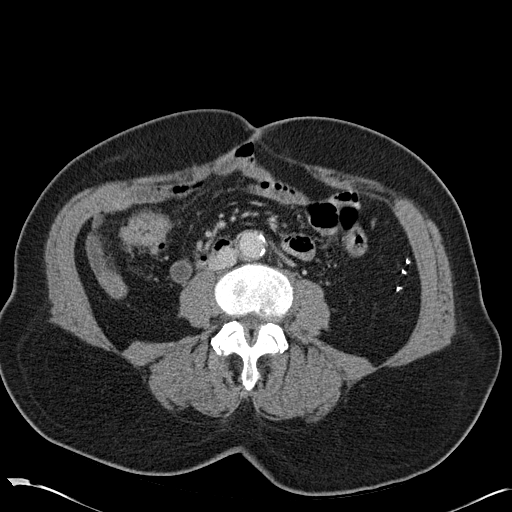
[im 65/97  soft-tissue]
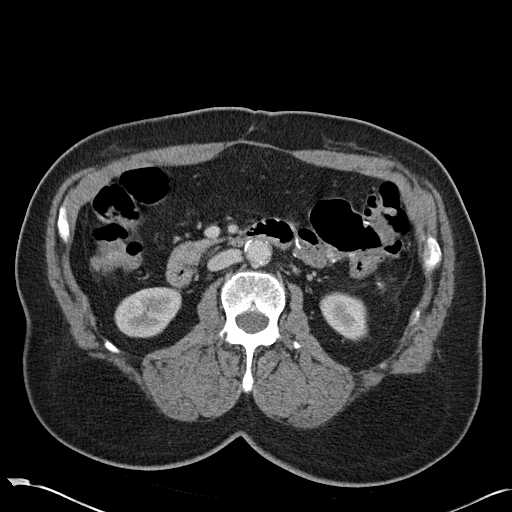
[im 65/97  bone]
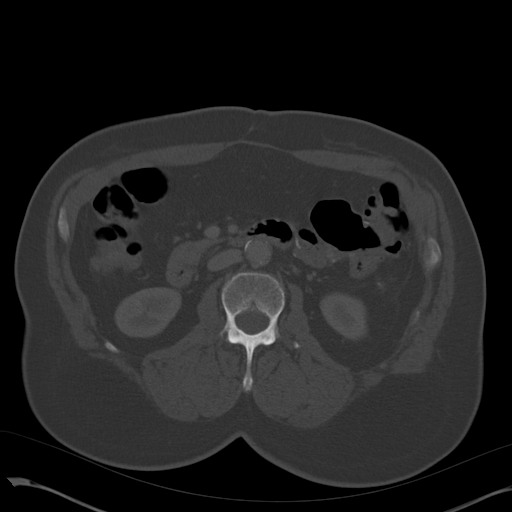
[im 69/97  soft-tissue]
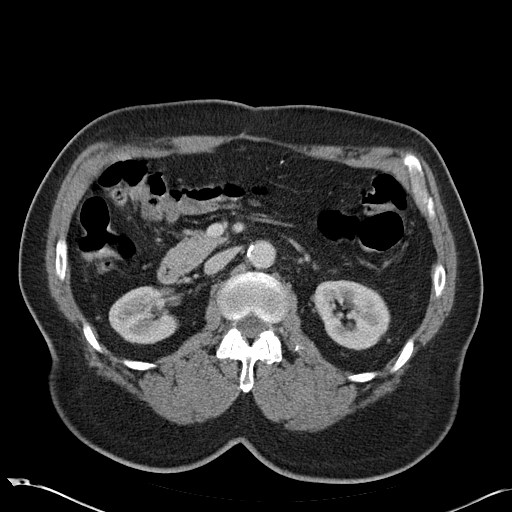
[im 78/97  soft-tissue]
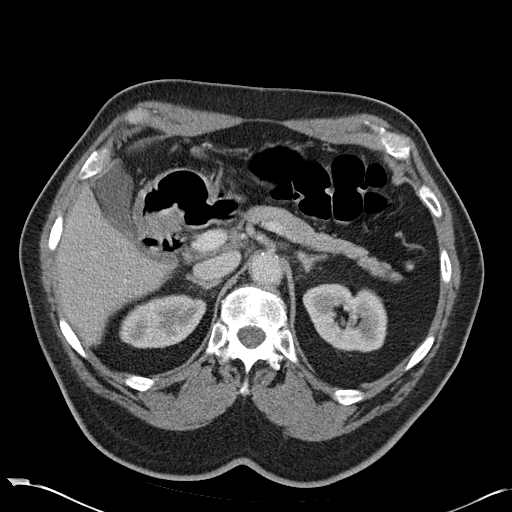
[im 83/97  soft-tissue]
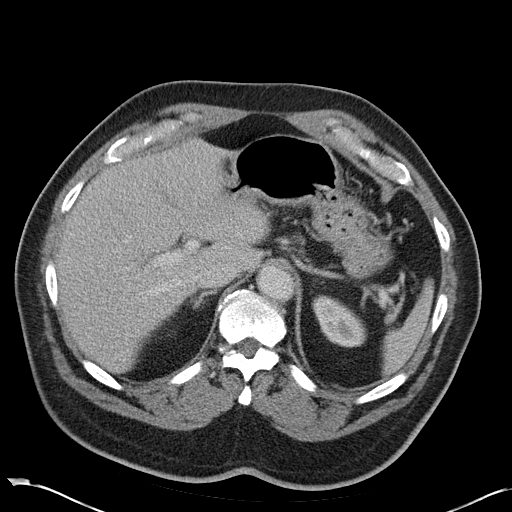
[im 92/97  soft-tissue]
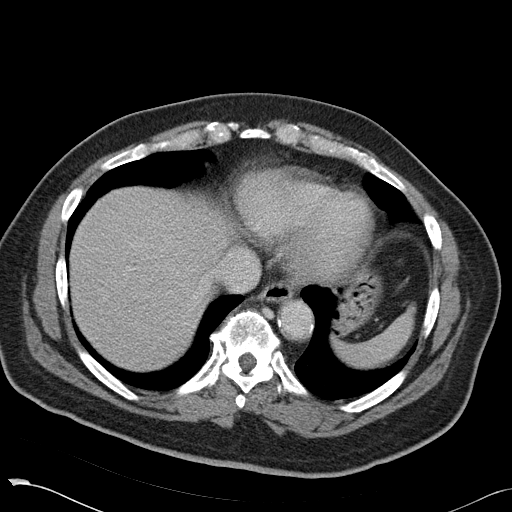

[Series 4: abd_pel_with 3.0 spo cor · coronal · 0.74mm/px · 3 of 91 slices shown]
[im 31/91  soft-tissue]
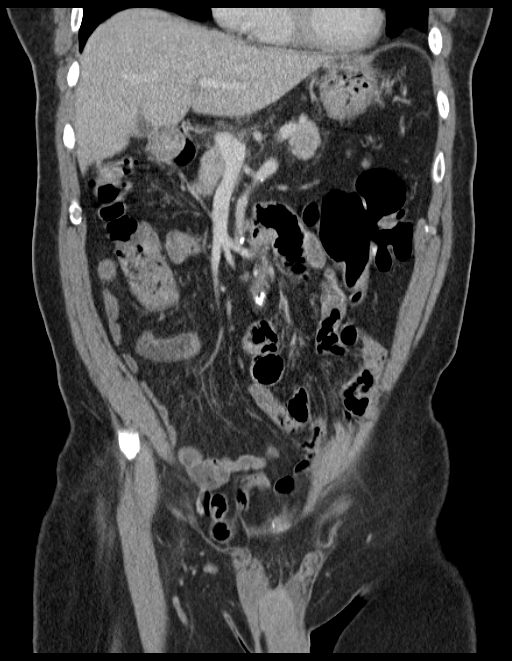
[im 41/91  soft-tissue]
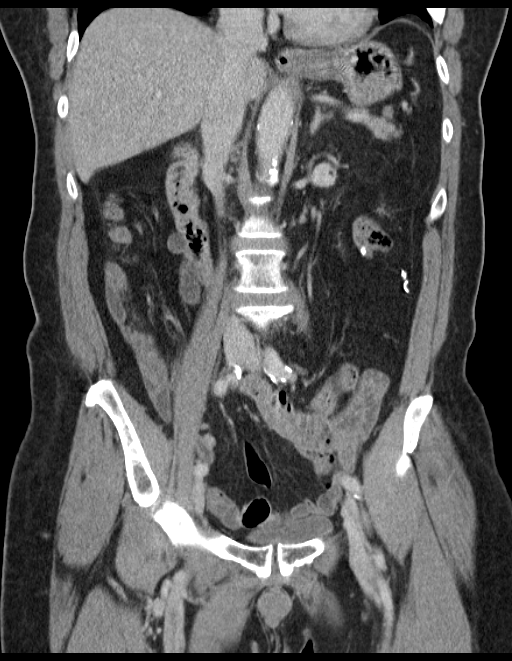
[im 51/91  soft-tissue]
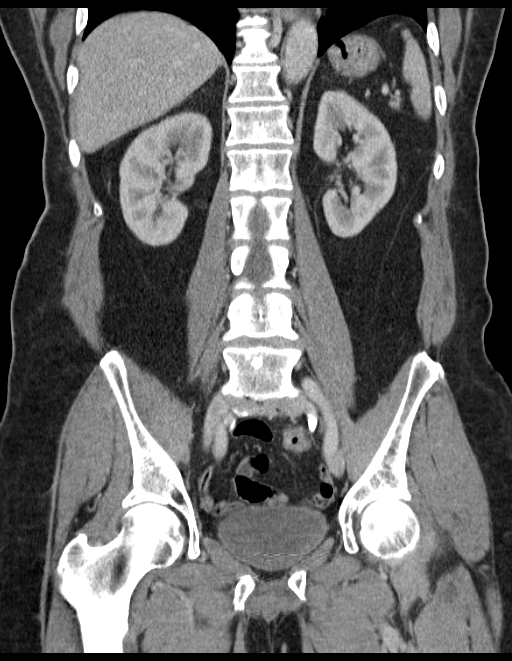

[16 of 46 positions shown; findings below may reference images not displayed]

FINDINGS: The liver, gallbladder, pancreas, spleen, adrenal glands, and
kidneys are normal in appearance. No evidence hydronephrosis. No
soft tissue masses or lymphadenopathy identified within the abdomen
or pelvis.

No evidence of inflammatory process or abnormal fluid collections.
No evidence of bowel obstruction. Surgical clips again seen along
the greater omentum and left paracolic gutter.

Mixed sclerotic and lytic bone metastases involving the pelvis,
lumbar spine, and left rib show no significant change.
IMPRESSION: Stable bone metastases.

No evidence of abdominal or pelvic soft tissue metastatic disease or
other acute findings.
